# Patient Record
Sex: Male | Born: 1985 | Race: White | Hispanic: No | Marital: Married | Smoking: Never smoker
Health system: Southern US, Community
[De-identification: ages and names within clinical notes are randomized; demographics above are authoritative.]

## PROBLEM LIST (undated history)

## (undated) ENCOUNTER — Emergency Department (HOSPITAL_COMMUNITY): Admission: EM | Payer: BC Managed Care – PPO | Source: Home / Self Care

## (undated) DIAGNOSIS — K760 Fatty (change of) liver, not elsewhere classified: Secondary | ICD-10-CM

## (undated) DIAGNOSIS — T7840XA Allergy, unspecified, initial encounter: Secondary | ICD-10-CM

## (undated) DIAGNOSIS — K900 Celiac disease: Secondary | ICD-10-CM

## (undated) DIAGNOSIS — R569 Unspecified convulsions: Secondary | ICD-10-CM

## (undated) DIAGNOSIS — K219 Gastro-esophageal reflux disease without esophagitis: Secondary | ICD-10-CM

## (undated) HISTORY — DX: Unspecified convulsions: R56.9

## (undated) HISTORY — DX: Celiac disease: K90.0

## (undated) HISTORY — PX: UPPER GASTROINTESTINAL ENDOSCOPY: SHX188

## (undated) HISTORY — PX: HAND SURGERY: SHX662

## (undated) HISTORY — DX: Gastro-esophageal reflux disease without esophagitis: K21.9

## (undated) HISTORY — DX: Fatty (change of) liver, not elsewhere classified: K76.0

## (undated) HISTORY — DX: Allergy, unspecified, initial encounter: T78.40XA

---
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
## 2009-03-19 ENCOUNTER — Emergency Department (HOSPITAL_COMMUNITY): Admission: EM | Admit: 2009-03-19 | Discharge: 2009-03-19 | Payer: Self-pay | Admitting: Emergency Medicine

## 2009-04-20 ENCOUNTER — Emergency Department (HOSPITAL_COMMUNITY): Admission: EM | Admit: 2009-04-20 | Discharge: 2009-04-20 | Payer: Self-pay | Admitting: Emergency Medicine

## 2009-05-05 ENCOUNTER — Emergency Department (HOSPITAL_COMMUNITY): Admission: EM | Admit: 2009-05-05 | Discharge: 2009-05-05 | Payer: Self-pay | Admitting: Emergency Medicine

## 2011-09-24 ENCOUNTER — Encounter: Payer: Self-pay | Admitting: Gastroenterology

## 2011-09-24 ENCOUNTER — Encounter: Payer: Self-pay | Admitting: Internal Medicine

## 2016-03-04 ENCOUNTER — Encounter: Payer: Self-pay | Admitting: Family Medicine

## 2016-03-04 ENCOUNTER — Ambulatory Visit (INDEPENDENT_AMBULATORY_CARE_PROVIDER_SITE_OTHER): Payer: Medicaid Other | Admitting: Family Medicine

## 2016-03-04 VITALS — BP 114/74 | HR 69 | Temp 97.0°F | Ht 74.0 in | Wt 238.6 lb

## 2016-03-04 DIAGNOSIS — K900 Celiac disease: Secondary | ICD-10-CM

## 2016-03-04 DIAGNOSIS — K529 Noninfective gastroenteritis and colitis, unspecified: Secondary | ICD-10-CM | POA: Diagnosis not present

## 2016-03-04 DIAGNOSIS — Z0000 Encounter for general adult medical examination without abnormal findings: Secondary | ICD-10-CM

## 2016-03-04 MED ORDER — RIFAXIMIN 550 MG PO TABS: 550.0000 mg | ORAL_TABLET | Freq: Three times a day (TID) | ORAL | Status: DC

## 2016-03-04 NOTE — Progress Notes (Signed)
   HPI  Patient presents today here to establish care.  Patient has complaints of chronic diarrhea, 5-7 episodes daily without medication. He comes well using 4 Imodium daily. He states that his symptoms come on along with crampy lower abdominal pain that is mild to moderate, that is relieved by bowel movement. He has no blood in his diarrhea. He is diagnosed with celiac disease with an EGD when he was 30 years old.  He states that he has had mild anxiety symptoms. There. With mild shortness of breath and mild palpitations No lightheadedness, chest pain, or dizziness with symptoms.  He works out, [employer] 2-3 times a week, also playing basketball. He watches his diet moderately carefully He has a cup of coffee and one soda daily.   PMH: Smoking status noted His past medical, surgical, social, family history reviewed and updated in EMR His past medical history significant for seizure disorder and celiac disease His family history significant for diabetes in all grandparents as well as heart disease in his father and hypertension in father and sister. ROS: Per HPI  Objective: BP 114/74 mmHg  Pulse 69  Temp(Src) 97 F (36.1 C) (Oral)  Ht _0  (1.88 m)  Wt 238 lb 9.6 oz (108.228 kg)  BMI 30.62 kg/m2 Gen: NAD, alert, cooperative with exam HEENT: NCAT, EOMI, PERRL CV: RRR, good S1/S2, no murmur Resp: CTABL, no wheezes, non-labored Abd: SNTND, BS present, no guarding or organomegaly Ext: No edema, warm Neuro: Alert and oriented, strength 5/5 and sensation intact in all 4 extremities  Assessment and plan:  # Celiac disease Doing very well avoiding gluten, , no complaints States that he is confident that his diarrhea is not caused by this as he avoids gluten very well. Also avoiding extra lactose.  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 CMP14+EGFR  . CBC with Differential  . Lipid Panel  . TSH    Meds ordered this encounter  Medications  . Loperamide HCl (ANTI-DIARRHEAL PO)    Sig: Take by mouth.  . rifaximin (XIFAXAN) 550 MG TABS tablet    Sig: Take 1 tablet (550 mg total) by mouth 3 (three) times daily.    Dispense:  42 tablet    Refill:  0    Laroy Apple, MD [facility] 03/04/2016, 2:05 PM

## 2016-03-04 NOTE — Patient Instructions (Signed)
Great to meet you!  Try the xifaxan for your diarrhea, 1 pill three times a day for 2 weeks  Conservative measures for IBS- diarrheal type include Eating a good pro-biotic containing yogurt daily (activia is one example)  Taking metamucil 1 scoop daily  Lets follow up in 1 month to see how you are doing

## 2016-03-05 LAB — LIPID PANEL
CHOLESTEROL TOTAL: 192 mg/dL (ref 100–199)
Chol/HDL Ratio: 5.5 ratio units — ABNORMAL HIGH (ref 0.0–5.0)
HDL: 35 mg/dL — ABNORMAL LOW (ref >39)
LDL CALC: 109 mg/dL — AB (ref 0–99)
TRIGLYCERIDES: 238 mg/dL — AB (ref 0–149)
VLDL Cholesterol Cal: 48 mg/dL — ABNORMAL HIGH (ref 5–40)

## 2016-03-05 LAB — CBC WITH DIFFERENTIAL/PLATELET
BASOS: 1 %
Basophils Absolute: 0.1 10*3/uL (ref 0.0–0.2)
EOS (ABSOLUTE): 0.3 10*3/uL (ref 0.0–0.4)
EOS: 4 %
HEMOGLOBIN: 16.4 g/dL (ref 12.6–17.7)
Hematocrit: 47 % (ref 37.5–51.0)
IMMATURE GRANS (ABS): 0 10*3/uL (ref 0.0–0.1)
IMMATURE GRANULOCYTES: 0 %
LYMPHS ABS: 2.2 10*3/uL (ref 0.7–3.1)
Lymphs: 27 %
MCH: 31.1 pg (ref 26.6–33.0)
MCHC: 34.9 g/dL (ref 31.5–35.7)
MCV: 89 fL (ref 79–97)
MONOS ABS: 0.7 10*3/uL (ref 0.1–0.9)
Monocytes: 9 %
NEUTROS ABS: 4.9 10*3/uL (ref 1.4–7.0)
NEUTROS PCT: 59 %
PLATELETS: 300 10*3/uL (ref 150–379)
RBC: 5.28 x10E6/uL (ref 4.14–5.80)
RDW: 13.9 % (ref 12.3–15.4)
WBC: 8.1 10*3/uL (ref 3.4–10.8)

## 2016-03-05 LAB — CMP14+EGFR
ALBUMIN: 4.9 g/dL (ref 3.5–5.5)
ALT: 61 IU/L — ABNORMAL HIGH (ref 0–44)
AST: 30 IU/L (ref 0–40)
Albumin/Globulin Ratio: 1.8 (ref 1.2–2.2)
Alkaline Phosphatase: 83 IU/L (ref 39–117)
BUN / CREAT RATIO: 13 (ref 9–20)
BUN: 11 mg/dL (ref 6–20)
Bilirubin Total: 0.8 mg/dL (ref 0.0–1.2)
CALCIUM: 9.6 mg/dL (ref 8.7–10.2)
CO2: 25 mmol/L (ref 18–29)
CREATININE: 0.88 mg/dL (ref 0.76–1.27)
Chloride: 99 mmol/L (ref 96–106)
GFR, EST AFRICAN AMERICAN: 132 mL/min/{1.73_m2} (ref >59)
GFR, EST NON AFRICAN AMERICAN: 114 mL/min/{1.73_m2} (ref >59)
GLOBULIN, TOTAL: 2.7 g/dL (ref 1.5–4.5)
Glucose: 86 mg/dL (ref 65–99)
Potassium: 4.1 mmol/L (ref 3.5–5.2)
SODIUM: 140 mmol/L (ref 134–144)
TOTAL PROTEIN: 7.6 g/dL (ref 6.0–8.5)

## 2016-03-05 LAB — TSH: TSH: 2.54 u[IU]/mL (ref 0.450–4.500)

## 2016-04-04 ENCOUNTER — Ambulatory Visit (INDEPENDENT_AMBULATORY_CARE_PROVIDER_SITE_OTHER): Payer: Medicaid Other | Admitting: Family Medicine

## 2016-04-04 ENCOUNTER — Encounter: Payer: Self-pay | Admitting: Family Medicine

## 2016-04-04 VITALS — BP 133/75 | HR 72 | Temp 97.1°F | Ht 74.0 in | Wt 241.6 lb

## 2016-04-04 DIAGNOSIS — R7401 Elevation of levels of liver transaminase levels: Secondary | ICD-10-CM

## 2016-04-04 DIAGNOSIS — K529 Noninfective gastroenteritis and colitis, unspecified: Secondary | ICD-10-CM | POA: Diagnosis not present

## 2016-04-04 DIAGNOSIS — R740 Nonspecific elevation of levels of transaminase and lactic acid dehydrogenase [LDH]: Secondary | ICD-10-CM

## 2016-04-04 MED ORDER — DICYCLOMINE HCL 20 MG PO TABS: 20.0000 mg | ORAL_TABLET | Freq: Three times a day (TID) | ORAL | Status: DC

## 2016-04-04 NOTE — Patient Instructions (Signed)
Great to see you!  I have sent dicyclomine (bentyl) for your loose stools, try 1 pill before each meal  Come back in 1 month for follow up

## 2016-04-04 NOTE — Progress Notes (Signed)
   HPI  Patient presents today here to follow-up for chronic diarrhea.  Patient tried a course of xifaxim, helped while he was on, he has now returned to his normal stool pattern. He has 6-7 loose stools daily.  He has celiac disease and avoid school easily.  He has been taking Imodium with mild improvement. 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  # Elevated transaminase, elevated ALT Discussed diet and exercise, possible developing fatty liver If persistent after 6 months consider ultrasound   Meds ordered this encounter  Medications  . dicyclomine (BENTYL) 20 MG tablet    Sig: Take 1 tablet (20 mg total) by mouth 3 (three) times daily before meals.    Dispense:  90 tablet    Refill:  [place], MD [facility] 04/04/2016, 3:13 PM

## 2016-05-07 ENCOUNTER — Ambulatory Visit (INDEPENDENT_AMBULATORY_CARE_PROVIDER_SITE_OTHER): Payer: Medicaid Other | Admitting: Family Medicine

## 2016-05-07 ENCOUNTER — Encounter: Payer: Self-pay | Admitting: Family Medicine

## 2016-05-07 VITALS — BP 124/72 | HR 82 | Temp 96.8°F | Ht 74.0 in | Wt 242.6 lb

## 2016-05-07 DIAGNOSIS — K529 Noninfective gastroenteritis and colitis, unspecified: Secondary | ICD-10-CM | POA: Diagnosis not present

## 2016-05-07 DIAGNOSIS — K900 Celiac disease: Secondary | ICD-10-CM

## 2016-05-07 NOTE — Patient Instructions (Addendum)
Great to see you!  I have sent a referral for a GI doctor for your diarrhea.   Continue to use bentyl as it seems like it is helping you with cramping.

## 2016-05-07 NOTE — Progress Notes (Signed)
   HPI  Patient presents today here for follow-up diarrhea.  Patient's menses had a long history of chronic diarrhea. He states this is been going on for years. He has reasonable control of symptoms with 4 Imodium daily. He was having abdominal cramps with it which have been helped by Bentyl.  We tried a course of rifaximin which helped transiently with return of diarrhea shortly after the medication..  Explains that when he was 30 years old he had issues with blood in his stool and vomit. He was told that he had a "knot in his stomach size of a fist". He does not remember having an EGD or colonoscopy but he was diagnosed with celiac disease. He states that last night he cut out milk products completely, he has changed from 6 stools daily to 4 stools daily.  He explains that he is on permanent disability, however the reason behind it is very unclear.  We are requesting GI records today. He describes being seen 15 years ago at [facility] by Dr. Dollene Primrose  PMH: Smoking status noted ROS: Per HPI  Objective: BP 124/72 mmHg  Pulse 82  Temp(Src) 96.8 F (36 C) (Oral)  Ht 6\' 2"  (1.88 m)  Wt 242 lb 9.6 oz (110.043 kg)  BMI 31.13 kg/m2 Gen: NAD, alert, cooperative with exam HEENT: NCAT CV: RRR, good S1/S2, no murmur Resp: CTABL, no wheezes, non-labored Ext: No edema, warm Neuro: Alert and oriented, No gross deficits  Assessment and plan:  # Chronic diarrhea Patient with only improvement in cramping with Bentyl. I suspect IBS diarrheal type, however he's having symptoms that wake him up in the middle the night and have persisted for years now. We are requesting records from his previous GI doctor Referring to GI, [place], He is not been back to GI since he was 30 years old, approximately 15 years ago.  He feels that he is avoiding gluten very well.      Orders Placed This Encounter  Procedures  . Ambulatory referral to Gastroenterology    Referral Priority:  Routine    Referral  Type:  Consultation    Referral Reason:  Specialty Services Required    Number of Visits Requested:  [place], MD [facility] 05/07/2016, 4:29 PM

## 2016-05-08 ENCOUNTER — Encounter: Payer: Self-pay | Admitting: Gastroenterology

## 2016-06-20 HISTORY — PX: COLONOSCOPY: SHX174

## 2016-07-14 ENCOUNTER — Ambulatory Visit (INDEPENDENT_AMBULATORY_CARE_PROVIDER_SITE_OTHER): Payer: Medicaid Other | Admitting: Gastroenterology

## 2016-07-14 ENCOUNTER — Other Ambulatory Visit (INDEPENDENT_AMBULATORY_CARE_PROVIDER_SITE_OTHER): Payer: Medicaid Other

## 2016-07-14 ENCOUNTER — Encounter: Payer: Self-pay | Admitting: Gastroenterology

## 2016-07-14 ENCOUNTER — Encounter (INDEPENDENT_AMBULATORY_CARE_PROVIDER_SITE_OTHER): Payer: Self-pay

## 2016-07-14 VITALS — BP 114/76 | HR 80 | Ht 73.0 in | Wt 245.1 lb

## 2016-07-14 DIAGNOSIS — R140 Abdominal distension (gaseous): Secondary | ICD-10-CM | POA: Diagnosis not present

## 2016-07-14 DIAGNOSIS — R197 Diarrhea, unspecified: Secondary | ICD-10-CM | POA: Diagnosis not present

## 2016-07-14 DIAGNOSIS — R7401 Elevation of levels of liver transaminase levels: Secondary | ICD-10-CM

## 2016-07-14 DIAGNOSIS — R740 Nonspecific elevation of levels of transaminase and lactic acid dehydrogenase [LDH]: Secondary | ICD-10-CM | POA: Diagnosis not present

## 2016-07-14 LAB — CBC WITH DIFFERENTIAL/PLATELET
BASOS ABS: 0 10*3/uL (ref 0.0–0.1)
Basophils Relative: 0.4 % (ref 0.0–3.0)
EOS ABS: 0.3 10*3/uL (ref 0.0–0.7)
Eosinophils Relative: 4.5 % (ref 0.0–5.0)
HCT: 47 % (ref 39.0–52.0)
Hemoglobin: 16.9 g/dL (ref 13.0–17.0)
LYMPHS ABS: 1.6 10*3/uL (ref 0.7–4.0)
Lymphocytes Relative: 20.7 % (ref 12.0–46.0)
MCHC: 35.9 g/dL (ref 30.0–36.0)
MCV: 89.9 fl (ref 78.0–100.0)
Monocytes Absolute: 0.8 10*3/uL (ref 0.1–1.0)
Monocytes Relative: 10.6 % (ref 3.0–12.0)
NEUTROS ABS: 4.8 10*3/uL (ref 1.4–7.7)
NEUTROS PCT: 63.8 % (ref 43.0–77.0)
PLATELETS: 330 10*3/uL (ref 150.0–400.0)
RBC: 5.23 Mil/uL (ref 4.22–5.81)
RDW: 12.5 % (ref 11.5–15.5)
WBC: 7.5 10*3/uL (ref 4.0–10.5)

## 2016-07-14 LAB — VITAMIN D 25 HYDROXY (VIT D DEFICIENCY, FRACTURES): VITD: 23.51 ng/mL — AB (ref 30.00–100.00)

## 2016-07-14 LAB — HEPATIC FUNCTION PANEL
ALBUMIN: 4.3 g/dL (ref 3.5–5.2)
ALK PHOS: 73 U/L (ref 39–117)
ALT: 30 U/L (ref 0–53)
AST: 17 U/L (ref 0–37)
BILIRUBIN DIRECT: 0 mg/dL (ref 0.0–0.3)
Total Bilirubin: 0.7 mg/dL (ref 0.2–1.2)
Total Protein: 7.5 g/dL (ref 6.0–8.3)

## 2016-07-14 LAB — IGA: IgA: 338 mg/dL (ref 68–378)

## 2016-07-14 MED ORDER — NA SULFATE-K SULFATE-MG SULF 17.5-3.13-1.6 GM/177ML PO SOLN: 1.0000 | ORAL | 0 refills | Status: DC

## 2016-07-14 NOTE — Progress Notes (Signed)
HPI :  30 y/o male with a reported history of celiac disease and seizure disorder, here for a new patient visit for celiac disease.   He reports being diagnosed with celiac disease 16 years ago, at age 78. He endorses having had blood testing and endoscopy at the time. He reports having an abnormality in his stomach on EGD, he refers to as a a "knot" in his stomach, which stated he could have had surgery but left in place as he was told it was "benign". This was done by Dr. Matthew Saras in [place]. Reports of EGD not available, but he had a prior CT imaging at the time which did not show any significant pathology other than duodenitis.  He reports he has been on a gluten free diet longstanding, he thinks very strict about it. He reports about 6 BMs per day, loose stools, which has been his baseline. He has been taking immodium upwards of 4 BMs per day which has not helped too much. He stopped taking this as it has not helped at all. He is also taking bentyl for cramps which he thinks has helped a little bit. He has not seen any blood in his stools. He endorses some ongoing bloating / abdominal discomfort which bothers him. He thinks he has gained weight recently. No nightsweats or fevers.   No prior colonoscopy. No FH of known celiac disease. He thinks since starting gluten free diet he has not had significant improvement.   He denies any NSAID use.    Past Medical History:  Diagnosis Date  . Celiac disease   . Seizures ([place])    when patient was 30 years old     Past Surgical History:  Procedure Laterality Date  . HAND SURGERY Right    Family History  Problem Relation Age of Onset  . Hypertension Father   . Diabetes Maternal Grandmother   . Diabetes Maternal Grandfather   . Diabetes Paternal Grandmother   . Breast cancer Paternal Grandmother   . Diabetes Paternal Grandfather   . Hypertension Sister   . Heart disease Paternal Uncle    Social History  Substance Use Topics  .  Smoking status: Never Smoker  . Smokeless tobacco: Never Used  . Alcohol use Yes     Comment: 1 cocktail at night   Current Outpatient Prescriptions  Medication Sig Dispense Refill  . dicyclomine (BENTYL) 20 MG tablet Take 1 tablet (20 mg total) by mouth 3 (three) times daily before meals. 90 tablet 3  . Multiple Vitamin (MULTIVITAMIN) tablet Take 1 tablet by mouth daily.     No current facility-administered medications for this visit.    No Known Allergies   Review of Systems: All systems reviewed and negative except where noted in HPI.   Lab Results  Component Value Date   WBC 8.1 03/04/2016   HCT 47.0 03/04/2016   MCV 89 03/04/2016   PLT 300 03/04/2016    Lab Results  Component Value Date   CREATININE 0.88 03/04/2016   BUN 11 03/04/2016   NA 140 03/04/2016   K 4.1 03/04/2016   CL 99 03/04/2016   CO2 25 03/04/2016    Lab Results  Component Value Date   ALT 61 (H) 03/04/2016   AST 30 03/04/2016   ALKPHOS 83 03/04/2016   BILITOT 0.8 03/04/2016     Physical Exam: BP 114/76 (BP Location: Left Arm, Patient Position: Sitting)   Pulse 80   Ht 6\' 1"  (1.854 m) Comment: height  measured without shoes  Wt 245 lb 2 oz (111.2 kg)   BMI 32.34 kg/m  Constitutional: Pleasant,well-developed, male in no acute distress. HEENT: Normocephalic and atraumatic. Conjunctivae are normal. No scleral icterus. Neck supple.  Cardiovascular: Normal rate, regular rhythm.  Pulmonary/chest: Effort normal and breath sounds normal. No wheezing, rales or rhonchi. Abdominal: Soft, nondistended, nontender. Bowel sounds active throughout. There are no masses palpable. No hepatomegaly. Extremities: no edema Lymphadenopathy: No cervical adenopathy noted. Neurological: Alert and oriented to person place and time. Skin: Skin is warm and dry. No rashes noted. Psychiatric: Normal mood and affect. Behavior is normal.   ASSESSMENT AND PLAN: 30 y/o male with history of reported celiac with ongoing  bloating / diarrhea despite gluten free diet. He reports persistent symptoms for years despite gluten free diet. Unclear at this time if he has active celiac disease driving his symptoms, or another process. Given his history of celiac, he is also at risk for microscopic colitis. Recommend labs with TTG, IgA to see if celiac is active, as well as baseline CBC, LFTs and vitamin D level (he reports history of elevated ALT - which may be related to celiac if active). Ultimately recommend an EGD to reassess his celiac activity, and also offered him a colonoscopy to ensure no microscopic colitis / IBD. Discussed risks / benefits of endoscopy with him and he wished to proceed. Unclear what he is referring to in regards to prior EGD abnormality in his stomach, will obtain reports if possible. If ALT elevation persists, will further workup with labs and [place], although as above, could be related to active celiac if this is found.   [place] Cellar, MD [facility] Pager [phone]  CC: Timmothy Euler, MD

## 2016-07-14 NOTE — Patient Instructions (Signed)
If you are age 32 or older, your body mass index should be between 23-30. Your Body mass index is 32.34 kg/m. If this is out of the aforementioned range listed, please consider follow up with your Primary Care Provider.  If you are age 44 or younger, your body mass index should be between 19-25. Your Body mass index is 32.34 kg/m. If this is out of the aformentioned range listed, please consider follow up with your Primary Care Provider.   You have been scheduled for a colonoscopy and endoscopy. Please follow written instructions given to you at your visit today.  Please pick up your prep supplies at the pharmacy within the next 1-3 days. If you use inhalers (even only as needed), please bring them with you on the day of your procedure. Your physician has requested that you go to www.startemmi.com and enter the access code given to you at your visit today. This web site gives a general overview about your procedure. However, you should still follow specific instructions given to you by our office regarding your preparation for the procedure.  Your physician has requested that you go to the basement for lab work before leaving today:

## 2016-07-15 ENCOUNTER — Encounter: Payer: Self-pay | Admitting: Gastroenterology

## 2016-07-15 ENCOUNTER — Ambulatory Visit (AMBULATORY_SURGERY_CENTER): Payer: Medicaid Other | Admitting: Gastroenterology

## 2016-07-15 VITALS — BP 123/88 | HR 65 | Temp 97.8°F | Resp 16 | Ht 73.0 in | Wt 245.0 lb

## 2016-07-15 DIAGNOSIS — D123 Benign neoplasm of transverse colon: Secondary | ICD-10-CM | POA: Diagnosis not present

## 2016-07-15 DIAGNOSIS — K209 Esophagitis, unspecified without bleeding: Secondary | ICD-10-CM

## 2016-07-15 DIAGNOSIS — R197 Diarrhea, unspecified: Secondary | ICD-10-CM

## 2016-07-15 DIAGNOSIS — D120 Benign neoplasm of cecum: Secondary | ICD-10-CM | POA: Diagnosis not present

## 2016-07-15 DIAGNOSIS — K900 Celiac disease: Secondary | ICD-10-CM | POA: Diagnosis not present

## 2016-07-15 DIAGNOSIS — K3189 Other diseases of stomach and duodenum: Secondary | ICD-10-CM | POA: Diagnosis not present

## 2016-07-15 LAB — TISSUE TRANSGLUTAMINASE, IGA: Tissue Transglutaminase Ab, IgA: 2 U/mL (ref <4)

## 2016-07-15 MED ORDER — OMEPRAZOLE 20 MG PO CPDR: 20.0000 mg | DELAYED_RELEASE_CAPSULE | Freq: Every day | ORAL | 3 refills | Status: DC

## 2016-07-15 MED ORDER — SODIUM CHLORIDE 0.9 % IV SOLN: 500.0000 mL | INTRAVENOUS | Status: DC

## 2016-07-15 NOTE — Op Note (Signed)
[place] [facility] Patient Name: Edward Rivera Procedure Date: 07/15/2016 9:45 AM MRN: [id number] Endoscopist: Remo Lipps P. Havery Moros , MD Age: 30 Referring MD:  Date of Birth: 03/07/1986 Gender: Male Account #: [id number] Procedure:                Upper GI endoscopy Indications:              Follow-up of reported celiac disease, ongoing loose                            stools Medicines:                Monitored Anesthesia Care Procedure:                Pre-Anesthesia Assessment:                           - Prior to the procedure, a History and Physical                            was performed, and patient medications and                            allergies were reviewed. The patient's tolerance of                            previous anesthesia was also reviewed. The risks                            and benefits of the procedure and the sedation                            options and risks were discussed with the patient.                            All questions were answered, and informed consent                            was obtained. Prior Anticoagulants: The patient has                            taken no previous anticoagulant or antiplatelet                            agents. ASA Grade Assessment: II - A patient with                            mild systemic disease. After reviewing the risks                            and benefits, the patient was deemed in                            satisfactory condition to undergo the procedure.  After obtaining informed consent, the endoscope was                            passed under direct vision. Throughout the                            procedure, the patient's blood pressure, pulse, and                            oxygen saturations were monitored continuously. The                            Model GIF-HQ190 [id number]) scope was introduced                            through the mouth, and advanced to the second  part                            of duodenum. The upper GI endoscopy was                            accomplished without difficulty. The patient                            tolerated the procedure well. Scope In: Scope Out: Findings:                 Esophagogastric landmarks were identified: the                            Z-line was found at 39 cm, the gastroesophageal                            junction was found at 39 cm and the upper extent of                            the gastric folds was found at 39 cm from the                            incisors.                           Mild esophagitis was found at the GEJ.                           Mucosal changes characterized by furrowing and                            trachealization were found in the lower third of                            the esophagus. Biopsies were taken with a cold  forceps for histology to rule out EoE.                           The exam of the esophagus was otherwise normal.                           The entire examined stomach was normal.                           The duodenal bulb and second portion of the                            duodenum were normal. Biopsies for histology were                            taken with a cold forceps for evaluation of celiac                            disease. Complications:            No immediate complications. Estimated blood loss:                            Minimal. Estimated Blood Loss:     Estimated blood loss was minimal. Impression:               - Esophagogastric landmarks identified.                           - Mild esophagitis.                           - Changes concerning for possible EoE in the distal                            esophagus. Biopsied.                           - Normal stomach.                           - Normal duodenal bulb and second portion of the                            duodenum. Biopsied. Recommendation:           -  Patient has a contact number available for                            emergencies. The signs and symptoms of potential                            delayed complications were discussed with the                            patient. Return to normal activities tomorrow.  Written discharge instructions were provided to the                            patient.                           - Resume previous diet.                           - Continue present medications.                           - Await pathology results.                           - Trial of omeprazole 20mg  daily for one month for                            treatment of esophagitis. Remo Lipps P. [place]br[place], MD 07/15/2016 10:33:31 AM This report has been signed electronically.

## 2016-07-15 NOTE — Patient Instructions (Signed)
YOU HAD AN ENDOSCOPIC PROCEDURE TODAY AT [facility]:   Refer to the procedure report that was given to you for any specific questions about what was found during the examination.  If the procedure report does not answer your questions, please call your gastroenterologist to clarify.  If you requested that your care partner not be given the details of your procedure findings, then the procedure report has been included in a sealed envelope for you to review at your convenience later.  YOU SHOULD EXPECT: Some feelings of bloating in the abdomen. Passage of more gas than usual.  Walking can help get rid of the air that was put into your GI tract during the procedure and reduce the bloating. If you had a lower endoscopy (such as a colonoscopy or flexible sigmoidoscopy) you may notice spotting of blood in your stool or on the toilet paper. If you underwent a bowel prep for your procedure, you may not have a normal bowel movement for a few days.  Please Note:  You might notice some irritation and congestion in your nose or some drainage.  This is from the oxygen used during your procedure.  There is no need for concern and it should clear up in a day or so.  SYMPTOMS TO REPORT IMMEDIATELY:   Following lower endoscopy (colonoscopy or flexible sigmoidoscopy):  Excessive amounts of blood in the stool  Significant tenderness or worsening of abdominal pains  Swelling of the abdomen that is new, acute  Fever of 100F or higher   Following upper endoscopy (EGD)  Vomiting of blood or coffee ground material  New chest pain or pain under the shoulder blades  Painful or persistently difficult swallowing  New shortness of breath  Fever of 100F or higher  Black, tarry-looking stools  For urgent or emergent issues, a gastroenterologist can be reached at any hour by calling [phone].   DIET:  We do recommend a small meal at first, but then you may proceed to your regular diet.  Drink  plenty of fluids but you should avoid alcoholic beverages for 24 hours.  ACTIVITY:  You should plan to take it easy for the rest of today and you should NOT DRIVE or use heavy machinery until tomorrow (because of the sedation medicines used during the test).    FOLLOW UP: Our staff will call the number listed on your records the next business day following your procedure to check on you and address any questions or concerns that you may have regarding the information given to you following your procedure. If we do not reach you, we will leave a message.  However, if you are feeling well and you are not experiencing any problems, there is no need to return our call.  We will assume that you have returned to your regular daily activities without incident.  If any biopsies were taken you will be contacted by phone or by letter within the next 1-3 weeks.  Please call us at [phone] if you have not heard about the biopsies in 3 weeks.    SIGNATURES/CONFIDENTIALITY: You and/or your care partner have signed paperwork which will be entered into your electronic medical record.  These signatures attest to the fact that that the information above on your After Visit Summary has been reviewed and is understood.  Full responsibility of the confidentiality of this discharge information lies with you and/or your care-partner.   Esophagitis information given.  Omeprazole 20mg . Daily for one month  trial.  Polyp information given.  No aspirin, naproxen, or ibuprofen or other non steroidal anti-inflammatory meds for 2 weeks.

## 2016-07-15 NOTE — Progress Notes (Signed)
Report given to PACU RN, vss 

## 2016-07-15 NOTE — Op Note (Signed)
[facility] Patient Name: Edward Rivera Procedure Date: 07/15/2016 9:45 AM MRN: [id number] Endoscopist: Remo Lipps P. Havery Moros , MD Age: 30 Referring MD:  Date of Birth: 23-Nov-1985 Gender: Male Account #: [id number] Procedure:                Colonoscopy Indications:              Chronic diarrhea, reported history of celiac, no                            improvement on gluten free diet Medicines:                Monitored Anesthesia Care Procedure:                Pre-Anesthesia Assessment:                           - Prior to the procedure, a History and Physical                            was performed, and patient medications and                            allergies were reviewed. The patient's tolerance of                            previous anesthesia was also reviewed. The risks                            and benefits of the procedure and the sedation                            options and risks were discussed with the patient.                            All questions were answered, and informed consent                            was obtained. Prior Anticoagulants: The patient has                            taken no previous anticoagulant or antiplatelet                            agents. ASA Grade Assessment: II - A patient with                            mild systemic disease. After reviewing the risks                            and benefits, the patient was deemed in                            satisfactory condition to undergo the procedure.  After obtaining informed consent, the colonoscope                            was passed under direct vision. Throughout the                            procedure, the patient's blood pressure, pulse, and                            oxygen saturations were monitored continuously. The                            Model CF-HQ190L [id number]) scope was introduced                            through the anus and  advanced to the the terminal                            ileum, with identification of the appendiceal                            orifice and IC valve. The colonoscopy was performed                            without difficulty. The patient tolerated the                            procedure well. The quality of the bowel                            preparation was good. The terminal ileum, ileocecal                            valve, appendiceal orifice, and rectum were                            photographed. Scope In: 10:01:11 AM Scope Out: 10:20:16 AM Scope Withdrawal Time: 0 hours 13 minutes 47 seconds  Total Procedure Duration: 0 hours 19 minutes 5 seconds  Findings:                 The perianal and digital rectal examinations were                            normal.                           A roughly 8 to 10 mm polyp was found in the hepatic                            flexure. The polyp was sessile. The polyp was                            removed with a cold snare. Resection and retrieval  were complete.                           A diminutive polyp was found in the cecum. The                            polyp was sessile. The polyp was removed with a                            cold biopsy forceps. Resection and retrieval were                            complete.                           A localized area of nodular mucosa was found at the                            ileocecal valve. I suspect this is a benign normal                            variant, biopsies were taken with a cold forceps                            for histology to ensure no adenomatous change.                           The terminal ileum appeared normal.                           The exam was otherwise without abnormality on                            direct and retroflexion views.                           Biopsies for histology were taken with a cold                            forceps  from the right colon, left colon and                            transverse colon for evaluation of microscopic                            colitis. Complications:            No immediate complications. Estimated blood loss:                            Minimal. Estimated Blood Loss:     Estimated blood loss was minimal. Impression:               - One 8 to 10 mm polyp at the hepatic flexure,  removed with a cold snare. Resected and retrieved.                           - One diminutive polyp in the cecum, removed with a                            cold biopsy forceps. Resected and retrieved.                           - Nodular mucosa at the ileocecal valve. Biopsied.                           - The examined portion of the ileum was normal.                           - The examination was otherwise normal on direct                            and retroflexion views.                           - Biopsies were taken with a cold forceps from the                            right colon, left colon and transverse colon for                            evaluation of microscopic colitis. Recommendation:           - Patient has a contact number available for                            emergencies. The signs and symptoms of potential                            delayed complications were discussed with the                            patient. Return to normal activities tomorrow.                            Written discharge instructions were provided to the                            patient.                           - Resume previous diet.                           - Continue present medications.                           - No aspirin, ibuprofen, naproxen, or other  non-steroidal anti-inflammatory drugs for 2 weeks                            after polyp removal.                           - Await pathology results.                           - Repeat  colonoscopy is recommended for                            surveillance. The colonoscopy date will be                            determined after pathology results from today's                            exam become available for review. Remo Lipps P. Lyna Laningham, MD 07/15/2016 10:27:07 AM This report has been signed electronically.

## 2016-07-16 ENCOUNTER — Telehealth: Payer: Self-pay | Admitting: *Deleted

## 2016-07-16 ENCOUNTER — Telehealth: Payer: Self-pay

## 2016-07-16 NOTE — Progress Notes (Signed)
Letter mailed 07/16/16. jf

## 2016-07-16 NOTE — Telephone Encounter (Signed)
  Follow up Call-  Call back number 07/15/2016  Post procedure Call Back phone  # #[phone] cell  Permission to leave phone message Yes  Some recent data might be hidden    Patient was called for follow up after his procedure on 07/15/2016. No answer at the number given for follow up phone call. This was the second attempt to reach the patient. A message was left on the answering machine.

## 2016-07-16 NOTE — Telephone Encounter (Signed)
No answer, message left for the patient. 

## 2016-08-05 ENCOUNTER — Ambulatory Visit (INDEPENDENT_AMBULATORY_CARE_PROVIDER_SITE_OTHER): Payer: Medicaid Other | Admitting: Family Medicine

## 2016-08-05 ENCOUNTER — Encounter: Payer: Self-pay | Admitting: Family Medicine

## 2016-08-05 VITALS — BP 122/76 | HR 75 | Temp 96.7°F | Ht 73.0 in | Wt 249.0 lb

## 2016-08-05 DIAGNOSIS — J309 Allergic rhinitis, unspecified: Secondary | ICD-10-CM | POA: Diagnosis not present

## 2016-08-05 MED ORDER — CETIRIZINE HCL 10 MG PO TABS: 10.0000 mg | ORAL_TABLET | Freq: Every day | ORAL | 11 refills | Status: DC

## 2016-08-05 MED ORDER — MOMETASONE FUROATE 50 MCG/ACT NA SUSP: NASAL | 11 refills | Status: DC

## 2016-08-05 NOTE — Patient Instructions (Signed)
Great to see you!  Start zyrtec and nasonex daily, I think you will se improvement in a few days.

## 2016-08-05 NOTE — Progress Notes (Signed)
   HPI  Patient presents today here with nasal congestion and sore throat  He explains that he's been chopping wood outside all week and thought maybe it is allergies. He started having yellow mucus from the nose and came in for eval.   Deneis cough and dyspnea. Denies malaise, fever, chills, or feeling unwell.   He has bee clearing his throat a lot.   PMH: Smoking status noted ROS: Per HPI  Objective: BP 122/76   Pulse 75   Temp (!) 96.7 F (35.9 C) (Oral)   Ht 6\' 1"  (1.854 m)   Wt 249 lb (112.9 kg)   BMI 32.85 kg/m  Gen: NAD, alert, cooperative with exam HEENT: NCAT, nares with swollen turbinates BL, TMs WNL CV: RRR, good S1/S2, no murmur Resp: CTABL, no wheezes, non-labored Ext: No edema, warm Neuro: Alert and oriented, No gross deficits  Assessment and plan:  # Allergic rhinitis Start zyrtec and nasonex, RTC if worsening No signs of infection at this point.  Has GI follow up, reviewed recent results with him, he will maintain gluten free until GI clears him not to as to avoid skewing results.     Meds ordered this encounter  Medications  . cetirizine (ZYRTEC) 10 MG tablet    Sig: Take 1 tablet (10 mg total) by mouth daily.    Dispense:  30 tablet    Refill:  11  . mometasone (NASONEX) 50 MCG/ACT nasal spray    Sig: 2 sprays each nostril daily    Dispense:  17 g    Refill:  [place], MD [facility] 08/05/2016, 9:28 AM

## 2016-09-09 ENCOUNTER — Telehealth: Payer: Self-pay

## 2016-09-09 MED ORDER — FLUTICASONE PROPIONATE 50 MCG/ACT NA SUSP: 2.0000 | Freq: Every day | NASAL | 11 refills | Status: DC

## 2016-09-09 NOTE — Telephone Encounter (Signed)
Patient aware.

## 2016-09-09 NOTE — Telephone Encounter (Signed)
Change to flonase given formulary preference.   Laroy Apple, MD [facility] 09/09/2016, 11:56 AM

## 2016-10-06 ENCOUNTER — Ambulatory Visit: Payer: Medicaid Other | Admitting: *Deleted

## 2016-10-06 VITALS — Ht 74.0 in | Wt 253.0 lb

## 2016-10-06 DIAGNOSIS — Z86010 Personal history of colonic polyps: Secondary | ICD-10-CM

## 2016-10-06 MED ORDER — NA SULFATE-K SULFATE-MG SULF 17.5-3.13-1.6 GM/177ML PO SOLN: 1.0000 | Freq: Once | ORAL | 0 refills | Status: AC

## 2016-10-06 NOTE — Progress Notes (Signed)
Denies allergies to eggs or soy products. Denies complications with sedation or anesthesia. Denies O2 use. Denies use of diet or weight loss medications.  Emmi instructions not given for colonoscopy. Pt had procedure 3 months ago.

## 2016-10-15 ENCOUNTER — Encounter: Payer: Self-pay | Admitting: Gastroenterology

## 2016-10-15 ENCOUNTER — Ambulatory Visit (AMBULATORY_SURGERY_CENTER): Payer: Medicaid Other | Admitting: Gastroenterology

## 2016-10-15 VITALS — BP 108/69 | HR 76 | Temp 98.0°F | Resp 22 | Ht 74.0 in | Wt 253.0 lb

## 2016-10-15 DIAGNOSIS — D120 Benign neoplasm of cecum: Secondary | ICD-10-CM | POA: Diagnosis not present

## 2016-10-15 DIAGNOSIS — Z86010 Personal history of colonic polyps: Secondary | ICD-10-CM

## 2016-10-15 MED ORDER — SODIUM CHLORIDE 0.9 % IV SOLN: 500.0000 mL | INTRAVENOUS | Status: DC

## 2016-10-15 NOTE — Progress Notes (Signed)
To PACU, vss patent aw report to rn 

## 2016-10-15 NOTE — Op Note (Signed)
[facility] Patient Name: Edward Rivera Procedure Date: 10/15/2016 7:49 AM MRN: [id number] Endoscopist: Remo Lipps P. Armen Waring MD, MD Age: 30 Referring MD:  Date of Birth: 1986-07-29 Gender: Male Account #: [id number] Procedure:                Colonoscopy Indications:              High risk colon cancer surveillance: Personal                            history of colonic polyps - patient with multiple                            adenomas incidentally noted on colonoscopy in                            October to evaluate chronic diarrhea. Had a very                            subtle nodularity noted at the IC valve, biopsies                            returned c/w adenoma. Here for surveillance exam to                            evaluate IC valve, remove polyp entirely. Medicines:                Monitored Anesthesia Care Procedure:                Pre-Anesthesia Assessment:                           - Prior to the procedure, a History and Physical                            was performed, and patient medications and                            allergies were reviewed. The patient's tolerance of                            previous anesthesia was also reviewed. The risks                            and benefits of the procedure and the sedation                            options and risks were discussed with the patient.                            All questions were answered, and informed consent                            was obtained. Prior Anticoagulants: The patient has  taken no previous anticoagulant or antiplatelet                            agents. ASA Grade Assessment: II - A patient with                            mild systemic disease. After reviewing the risks                            and benefits, the patient was deemed in                            satisfactory condition to undergo the procedure.                           After obtaining  informed consent, the colonoscope                            was passed under direct vision. Throughout the                            procedure, the patient's blood pressure, pulse, and                            oxygen saturations were monitored continuously. The                            Model CF-HQ190L [id number]) scope was introduced                            through the anus and advanced to the the terminal                            ileum, with identification of the appendiceal                            orifice and IC valve. The colonoscopy was performed                            without difficulty. The patient tolerated the                            procedure well. The quality of the bowel                            preparation was good. The terminal ileum, ileocecal                            valve, appendiceal orifice, and rectum were                            photographed. Scope In: 8:01:05 AM Scope Out: 8:22:28 AM Scope Withdrawal Time: 0 hours 17 minutes 4 seconds  Total Procedure Duration: 0 hours 21  minutes 23 seconds  Findings:                 The perianal and digital rectal examinations were                            normal.                           A 5 to 6 mm polyp was found in the ileocecal valve,                            a clear border was appreciated when pleating back                            the valve with snare tip . The polyp was sessile.                            The polyp was removed with a cold snare. Resection                            and retrieval were complete.                           The terminal ileum appeared normal.                           The exam was otherwise without abnormality on                            direct and retroflexion views. Complications:            No immediate complications. Estimated blood loss:                            Minimal. Estimated Blood Loss:     Estimated blood loss was minimal. Impression:                - One 5 to 6 mm polyp at the ileocecal valve,                            removed with a cold snare. Resected and retrieved.                           - The examined portion of the ileum was normal.                           - The examination was otherwise normal on direct                            and retroflexion views. Recommendation:           - Patient has a contact number available for                            emergencies. The signs and symptoms of potential  delayed complications were discussed with the                            patient. Return to normal activities tomorrow.                            Written discharge instructions were provided to the                            patient.                           - Resume previous diet.                           - Continue present medications.                           - No aspirin, ibuprofen, naproxen, or other                            non-steroidal anti-inflammatory drugs for 2 weeks                            after polyp removal.                           - Await pathology results.                           - Repeat colonoscopy is recommended for                            surveillance in 3 years given history of adenoma up                            to 1cm in size on the last exam. Remo Lipps P. Seabron Iannello MD, MD 10/15/2016 8:27:41 AM This report has been signed electronically.

## 2016-10-15 NOTE — Progress Notes (Signed)
Called to room to assist during endoscopic procedure.  Patient ID and intended procedure confirmed with present staff. Received instructions for my participation in the procedure from the performing physician.  

## 2016-10-15 NOTE — Patient Instructions (Signed)
YOU HAD AN ENDOSCOPIC PROCEDURE TODAY AT [facility]:   Refer to the procedure report that was given to you for any specific questions about what was found during the examination.  If the procedure report does not answer your questions, please call your gastroenterologist to clarify.  If you requested that your care partner not be given the details of your procedure findings, then the procedure report has been included in a sealed envelope for you to review at your convenience later.  YOU SHOULD EXPECT: Some feelings of bloating in the abdomen. Passage of more gas than usual.  Walking can help get rid of the air that was put into your GI tract during the procedure and reduce the bloating. If you had a lower endoscopy (such as a colonoscopy or flexible sigmoidoscopy) you may notice spotting of blood in your stool or on the toilet paper. If you underwent a bowel prep for your procedure, you may not have a normal bowel movement for a few days.  Please Note:  You might notice some irritation and congestion in your nose or some drainage.  This is from the oxygen used during your procedure.  There is no need for concern and it should clear up in a day or so.  SYMPTOMS TO REPORT IMMEDIATELY:   Following lower endoscopy (colonoscopy or flexible sigmoidoscopy):  Excessive amounts of blood in the stool  Significant tenderness or worsening of abdominal pains  Swelling of the abdomen that is new, acute  Fever of 100F or higher  For urgent or emergent issues, a gastroenterologist can be reached at any hour by calling [phone].   DIET:  We do recommend a small meal at first, but then you may proceed to your regular diet.  Drink plenty of fluids but you should avoid alcoholic beverages for 24 hours.  ACTIVITY:  You should plan to take it easy for the rest of today and you should NOT DRIVE or use heavy machinery until tomorrow (because of the sedation medicines used during the test).     FOLLOW UP: Our staff will call the number listed on your records the next business day following your procedure to check on you and address any questions or concerns that you may have regarding the information given to you following your procedure. If we do not reach you, we will leave a message.  However, if you are feeling well and you are not experiencing any problems, there is no need to return our call.  We will assume that you have returned to your regular daily activities without incident.  Await biopsy results to determined follow up Colonoscopy possibly repeat in 3 years No Ibuprofen, Aspirin, Naproxen, or other non-steriodal anti-inflammatory drugs for next for two weeks after polyps removal.  If any biopsies were taken you will be contacted by phone or by letter within the next 1-3 weeks.  Please call us at [phone] if you have not heard about the biopsies in 3 weeks.    SIGNATURES/CONFIDENTIALITY: You and/or your care partner have signed paperwork which will be entered into your electronic medical record.  These signatures attest to the fact that that the information above on your After Visit Summary has been reviewed and is understood.  Full responsibility of the confidentiality of this discharge information lies with you and/or your care-partner.

## 2016-10-16 ENCOUNTER — Telehealth: Payer: Self-pay | Admitting: *Deleted

## 2016-10-16 ENCOUNTER — Telehealth: Payer: Self-pay

## 2016-10-16 NOTE — Telephone Encounter (Signed)
  Follow up Call-  Call back number 10/15/2016 07/15/2016  Post procedure Call Back phone  # [phone] cell  Permission to leave phone message Yes Yes  Some recent data might be hidden     No answer with 2nd follow up phone call attempt.  No message left on VM.

## 2016-10-16 NOTE — Telephone Encounter (Signed)
Number identifier. Left a voicemail. Will call back later.

## 2016-10-23 ENCOUNTER — Encounter: Payer: Self-pay | Admitting: Gastroenterology

## 2016-10-31 ENCOUNTER — Ambulatory Visit: Payer: Medicaid Other | Admitting: Family Medicine

## 2016-11-03 ENCOUNTER — Encounter: Payer: Self-pay | Admitting: Family Medicine

## 2016-11-11 ENCOUNTER — Ambulatory Visit (INDEPENDENT_AMBULATORY_CARE_PROVIDER_SITE_OTHER): Payer: Medicaid Other

## 2016-11-11 ENCOUNTER — Emergency Department (HOSPITAL_COMMUNITY): Payer: Medicaid Other | Admitting: Anesthesiology

## 2016-11-11 ENCOUNTER — Encounter: Payer: Self-pay | Admitting: Family Medicine

## 2016-11-11 ENCOUNTER — Encounter (HOSPITAL_COMMUNITY)
Admission: EM | Disposition: A | Discharge status: Home / Self Care | Payer: Self-pay | Source: Home / Self Care | Attending: General Surgery

## 2016-11-11 ENCOUNTER — Ambulatory Visit (HOSPITAL_COMMUNITY)
Admission: RE | Admit: 2016-11-11 | Discharge: 2016-11-11 | Disposition: A | Discharge status: Home / Self Care | Payer: Medicaid Other | Source: Ambulatory Visit | Attending: Family Medicine | Admitting: Family Medicine

## 2016-11-11 ENCOUNTER — Encounter (HOSPITAL_COMMUNITY): Payer: Self-pay

## 2016-11-11 ENCOUNTER — Inpatient Hospital Stay (HOSPITAL_COMMUNITY)
Admission: EM | Admit: 2016-11-11 | Discharge: 2016-11-13 | DRG: 343 | Disposition: A | Discharge status: Home / Self Care | Payer: Medicaid Other | Attending: General Surgery | Admitting: General Surgery

## 2016-11-11 ENCOUNTER — Ambulatory Visit (INDEPENDENT_AMBULATORY_CARE_PROVIDER_SITE_OTHER): Payer: Medicaid Other | Admitting: Family Medicine

## 2016-11-11 VITALS — BP 131/84 | HR 88 | Temp 96.9°F | Ht 74.0 in | Wt 254.0 lb

## 2016-11-11 DIAGNOSIS — R0609 Other forms of dyspnea: Secondary | ICD-10-CM | POA: Diagnosis not present

## 2016-11-11 DIAGNOSIS — K900 Celiac disease: Secondary | ICD-10-CM | POA: Diagnosis present

## 2016-11-11 DIAGNOSIS — R1030 Lower abdominal pain, unspecified: Secondary | ICD-10-CM

## 2016-11-11 DIAGNOSIS — K219 Gastro-esophageal reflux disease without esophagitis: Secondary | ICD-10-CM | POA: Diagnosis present

## 2016-11-11 DIAGNOSIS — Z79899 Other long term (current) drug therapy: Secondary | ICD-10-CM

## 2016-11-11 DIAGNOSIS — K3580 Unspecified acute appendicitis: Principal | ICD-10-CM | POA: Diagnosis present

## 2016-11-11 HISTORY — PX: LAPAROSCOPIC APPENDECTOMY: SHX408

## 2016-11-11 LAB — CBC WITH DIFFERENTIAL/PLATELET
BASOS: 0 %
Basophils Absolute: 0 10*3/uL (ref 0.0–0.2)
Basophils Absolute: 0 10*3/uL (ref 0.0–0.1)
Basophils Relative: 0 %
EOS (ABSOLUTE): 0.3 10*3/uL (ref 0.0–0.4)
EOS ABS: 0.2 10*3/uL (ref 0.0–0.7)
EOS: 3 %
Eosinophils Relative: 2 %
HCT: 45 % (ref 39.0–52.0)
HEMOGLOBIN: 16.6 g/dL (ref 13.0–17.7)
HEMOGLOBIN: 16.3 g/dL (ref 13.0–17.0)
Hematocrit: 46 % (ref 37.5–51.0)
LYMPHS ABS: 1.7 10*3/uL (ref 0.7–3.1)
LYMPHS ABS: 1.5 10*3/uL (ref 0.7–4.0)
Lymphocytes Relative: 16 %
Lymphs: 19 %
MCH: 32 pg (ref 26.6–33.0)
MCH: 31.8 pg (ref 26.0–34.0)
MCHC: 36.1 g/dL — ABNORMAL HIGH (ref 31.5–35.7)
MCHC: 36.2 g/dL — AB (ref 30.0–36.0)
MCV: 89 fL (ref 79–97)
MCV: 87.9 fL (ref 78.0–100.0)
MONOCYTES: 10 %
MONOS PCT: 9 %
Monocytes Absolute: 0.9 10*3/uL (ref 0.1–0.9)
Monocytes Absolute: 0.8 10*3/uL (ref 0.1–1.0)
NEUTROS PCT: 68 %
NEUTROS PCT: 73 %
Neutro Abs: 6.8 10*3/uL (ref 1.7–7.7)
Neutrophils Absolute: 6.2 10*3/uL (ref 1.4–7.0)
Platelets: 352 10*3/uL (ref 150–379)
Platelets: 313 10*3/uL (ref 150–400)
RBC: 5.19 x10E6/uL (ref 4.14–5.80)
RBC: 5.12 MIL/uL (ref 4.22–5.81)
RDW: 13.5 % (ref 12.3–15.4)
RDW: 12.7 % (ref 11.5–15.5)
WBC: 9.2 10*3/uL (ref 3.4–10.8)
WBC: 9.3 10*3/uL (ref 4.0–10.5)

## 2016-11-11 LAB — COMPREHENSIVE METABOLIC PANEL
A/G RATIO: 1.6 (ref 1.2–2.2)
ALBUMIN: 4.5 g/dL (ref 3.5–5.5)
ALT: 38 IU/L (ref 0–44)
AST: 19 IU/L (ref 0–40)
Alkaline Phosphatase: 83 IU/L (ref 39–117)
BILIRUBIN TOTAL: 0.4 mg/dL (ref 0.0–1.2)
BUN / CREAT RATIO: 8 — AB (ref 9–20)
BUN: 7 mg/dL (ref 6–20)
CHLORIDE: 100 mmol/L (ref 96–106)
CO2: 25 mmol/L (ref 18–29)
Calcium: 10.1 mg/dL (ref 8.7–10.2)
Creatinine, Ser: 0.83 mg/dL (ref 0.76–1.27)
GFR calc non Af Amer: 116 mL/min/{1.73_m2} (ref >59)
GFR, EST AFRICAN AMERICAN: 135 mL/min/{1.73_m2} (ref >59)
Globulin, Total: 2.8 g/dL (ref 1.5–4.5)
Glucose: 104 mg/dL — ABNORMAL HIGH (ref 65–99)
POTASSIUM: 4 mmol/L (ref 3.5–5.2)
SODIUM: 138 mmol/L (ref 134–144)
TOTAL PROTEIN: 7.3 g/dL (ref 6.0–8.5)

## 2016-11-11 LAB — BASIC METABOLIC PANEL
Anion gap: 9 (ref 5–15)
BUN: 7 mg/dL (ref 6–20)
CHLORIDE: 99 mmol/L — AB (ref 101–111)
CO2: 27 mmol/L (ref 22–32)
CREATININE: 0.97 mg/dL (ref 0.61–1.24)
Calcium: 9.2 mg/dL (ref 8.9–10.3)
GFR calc non Af Amer: >60 mL/min (ref >60)
Glucose, Bld: 97 mg/dL (ref 65–99)
Potassium: 3.9 mmol/L (ref 3.5–5.1)
Sodium: 135 mmol/L (ref 135–145)

## 2016-11-11 SURGERY — APPENDECTOMY, LAPAROSCOPIC
Anesthesia: General

## 2016-11-11 MED ORDER — IOPAMIDOL (ISOVUE-300) INJECTION 61%
INTRAVENOUS | Status: AC
  Filled 2016-11-11: qty 30

## 2016-11-11 MED ORDER — POVIDONE-IODINE 10 % OINT PACKET
TOPICAL_OINTMENT | CUTANEOUS | Status: DC | PRN
  Administered 2016-11-11: 1 via TOPICAL

## 2016-11-11 MED ORDER — BUPIVACAINE HCL (PF) 0.5 % IJ SOLN
INTRAMUSCULAR | Status: AC
  Filled 2016-11-11: qty 30

## 2016-11-11 MED ORDER — PANTOPRAZOLE SODIUM 40 MG PO TBEC
40.0000 mg | DELAYED_RELEASE_TABLET | Freq: Every day | ORAL | Status: DC
  Administered 2016-11-12: 40 mg via ORAL
  Filled 2016-11-11 (×3): qty 1

## 2016-11-11 MED ORDER — ENOXAPARIN SODIUM 40 MG/0.4ML ~~LOC~~ SOLN
40.0000 mg | SUBCUTANEOUS | Status: DC
  Administered 2016-11-12: 40 mg via SUBCUTANEOUS
  Filled 2016-11-11: qty 0.4

## 2016-11-11 MED ORDER — GLYCOPYRROLATE 0.2 MG/ML IJ SOLN
INTRAMUSCULAR | Status: AC
  Filled 2016-11-11: qty 3

## 2016-11-11 MED ORDER — LACTATED RINGERS IV SOLN
INTRAVENOUS | Status: DC
  Administered 2016-11-11: 1000 mL via INTRAVENOUS
  Administered 2016-11-11: 16:00:00 via INTRAVENOUS

## 2016-11-11 MED ORDER — FENTANYL CITRATE (PF) 100 MCG/2ML IJ SOLN
INTRAMUSCULAR | Status: DC | PRN
  Administered 2016-11-11 (×2): 25 ug via INTRAVENOUS
  Administered 2016-11-11 (×2): 50 ug via INTRAVENOUS
  Administered 2016-11-11 (×2): 25 ug via INTRAVENOUS
  Administered 2016-11-11: 50 ug via INTRAVENOUS

## 2016-11-11 MED ORDER — DEXTROSE 5 % IV SOLN
2.0000 g | INTRAVENOUS | Status: DC
  Filled 2016-11-11: qty 2

## 2016-11-11 MED ORDER — SUCCINYLCHOLINE CHLORIDE 20 MG/ML IJ SOLN
INTRAMUSCULAR | Status: DC | PRN
  Administered 2016-11-11: 160 mg via INTRAVENOUS

## 2016-11-11 MED ORDER — ONDANSETRON HCL 4 MG/2ML IJ SOLN: 4.0000 mg | Freq: Four times a day (QID) | INTRAMUSCULAR | Status: DC | PRN

## 2016-11-11 MED ORDER — ACETAMINOPHEN 325 MG PO TABS: 650.0000 mg | ORAL_TABLET | Freq: Four times a day (QID) | ORAL | Status: DC | PRN

## 2016-11-11 MED ORDER — HYDROMORPHONE HCL 1 MG/ML IJ SOLN
INTRAMUSCULAR | Status: AC
  Filled 2016-11-11: qty 0.5

## 2016-11-11 MED ORDER — DIPHENHYDRAMINE HCL 50 MG/ML IJ SOLN
INTRAMUSCULAR | Status: AC
  Filled 2016-11-11: qty 1

## 2016-11-11 MED ORDER — ROCURONIUM BROMIDE 100 MG/10ML IV SOLN
INTRAVENOUS | Status: DC | PRN
  Administered 2016-11-11: 5 mg via INTRAVENOUS
  Administered 2016-11-11: 25 mg via INTRAVENOUS

## 2016-11-11 MED ORDER — DIPHENHYDRAMINE HCL 25 MG PO CAPS: 25.0000 mg | ORAL_CAPSULE | Freq: Four times a day (QID) | ORAL | Status: DC | PRN

## 2016-11-11 MED ORDER — POVIDONE-IODINE 10 % EX OINT
TOPICAL_OINTMENT | CUTANEOUS | Status: AC
  Filled 2016-11-11: qty 1

## 2016-11-11 MED ORDER — LIDOCAINE HCL (CARDIAC) 10 MG/ML IV SOLN
INTRAVENOUS | Status: DC | PRN
  Administered 2016-11-11: 50 mg via INTRAVENOUS

## 2016-11-11 MED ORDER — NEOSTIGMINE METHYLSULFATE 10 MG/10ML IV SOLN
INTRAVENOUS | Status: AC
  Filled 2016-11-11: qty 1

## 2016-11-11 MED ORDER — KETOROLAC TROMETHAMINE 30 MG/ML IJ SOLN
INTRAMUSCULAR | Status: AC
  Filled 2016-11-11: qty 1

## 2016-11-11 MED ORDER — ACETAMINOPHEN 650 MG RE SUPP: 650.0000 mg | Freq: Four times a day (QID) | RECTAL | Status: DC | PRN

## 2016-11-11 MED ORDER — DIPHENHYDRAMINE HCL 50 MG/ML IJ SOLN: 25.0000 mg | Freq: Four times a day (QID) | INTRAMUSCULAR | Status: DC | PRN

## 2016-11-11 MED ORDER — DEXTROSE 5 % IV SOLN
1.0000 g | Freq: Once | INTRAVENOUS | Status: AC
  Administered 2016-11-11: 1 g via INTRAVENOUS
  Filled 2016-11-11: qty 10

## 2016-11-11 MED ORDER — DIPHENHYDRAMINE HCL 50 MG/ML IJ SOLN
INTRAMUSCULAR | Status: DC | PRN
  Administered 2016-11-11: 25 mg via INTRAVENOUS

## 2016-11-11 MED ORDER — LORAZEPAM 2 MG/ML IJ SOLN
1.0000 mg | INTRAMUSCULAR | Status: DC | PRN
  Administered 2016-11-11 – 2016-11-12 (×2): 1 mg via INTRAVENOUS
  Filled 2016-11-11 (×2): qty 1

## 2016-11-11 MED ORDER — OXYCODONE-ACETAMINOPHEN 5-325 MG PO TABS: 1.0000 | ORAL_TABLET | ORAL | Status: DC | PRN

## 2016-11-11 MED ORDER — DIPHENHYDRAMINE HCL 50 MG/ML IJ SOLN: 12.5000 mg | Freq: Four times a day (QID) | INTRAMUSCULAR | Status: DC | PRN

## 2016-11-11 MED ORDER — DIPHENHYDRAMINE HCL 12.5 MG/5ML PO ELIX: 12.5000 mg | ORAL_SOLUTION | Freq: Four times a day (QID) | ORAL | Status: DC | PRN

## 2016-11-11 MED ORDER — BUPIVACAINE HCL (PF) 0.5 % IJ SOLN
INTRAMUSCULAR | Status: DC | PRN
  Administered 2016-11-11: 10 mL

## 2016-11-11 MED ORDER — IOPAMIDOL (ISOVUE-300) INJECTION 61%
100.0000 mL | Freq: Once | INTRAVENOUS | Status: AC | PRN
  Administered 2016-11-11: 100 mL via INTRAVENOUS

## 2016-11-11 MED ORDER — MIDAZOLAM HCL 2 MG/2ML IJ SOLN
INTRAMUSCULAR | Status: AC
  Filled 2016-11-11: qty 2

## 2016-11-11 MED ORDER — KETOROLAC TROMETHAMINE 30 MG/ML IJ SOLN
30.0000 mg | Freq: Once | INTRAMUSCULAR | Status: AC
  Administered 2016-11-11: 30 mg via INTRAVENOUS

## 2016-11-11 MED ORDER — NALOXONE HCL 0.4 MG/ML IJ SOLN: 0.4000 mg | INTRAMUSCULAR | Status: DC | PRN

## 2016-11-11 MED ORDER — FENTANYL CITRATE (PF) 250 MCG/5ML IJ SOLN
INTRAMUSCULAR | Status: AC
  Filled 2016-11-11: qty 5

## 2016-11-11 MED ORDER — GLYCOPYRROLATE 0.2 MG/ML IJ SOLN
INTRAMUSCULAR | Status: DC | PRN
  Administered 2016-11-11: 0.6 mg via INTRAVENOUS

## 2016-11-11 MED ORDER — HYDROMORPHONE HCL 1 MG/ML IJ SOLN: 1.0000 mg | INTRAMUSCULAR | Status: DC | PRN

## 2016-11-11 MED ORDER — MIDAZOLAM HCL 2 MG/2ML IJ SOLN
0.5000 mg | INTRAMUSCULAR | Status: DC | PRN
Start: 2016-11-11 — End: 2016-11-11
  Administered 2016-11-11: 2 mg via INTRAVENOUS

## 2016-11-11 MED ORDER — CHLORHEXIDINE GLUCONATE CLOTH 2 % EX PADS: 6.0000 | MEDICATED_PAD | Freq: Once | CUTANEOUS | Status: DC

## 2016-11-11 MED ORDER — SODIUM CHLORIDE 0.9 % IR SOLN
Status: DC | PRN
  Administered 2016-11-11: 1000 mL

## 2016-11-11 MED ORDER — SIMETHICONE 80 MG PO CHEW: 40.0000 mg | CHEWABLE_TABLET | Freq: Four times a day (QID) | ORAL | Status: DC | PRN

## 2016-11-11 MED ORDER — SODIUM CHLORIDE 0.9 % IV BOLUS (SEPSIS)
1000.0000 mL | Freq: Once | INTRAVENOUS | Status: AC
  Administered 2016-11-11: 1000 mL via INTRAVENOUS

## 2016-11-11 MED ORDER — SODIUM CHLORIDE 0.9% FLUSH: 9.0000 mL | INTRAVENOUS | Status: DC | PRN

## 2016-11-11 MED ORDER — FENTANYL 40 MCG/ML IV SOLN: INTRAVENOUS | Status: DC

## 2016-11-11 MED ORDER — DEXAMETHASONE SODIUM PHOSPHATE 10 MG/ML IJ SOLN
INTRAMUSCULAR | Status: DC | PRN
  Administered 2016-11-11: 4 mg via INTRAVENOUS

## 2016-11-11 MED ORDER — NEOSTIGMINE METHYLSULFATE 10 MG/10ML IV SOLN
INTRAVENOUS | Status: DC | PRN
  Administered 2016-11-11: 3 mg via INTRAVENOUS

## 2016-11-11 MED ORDER — ONDANSETRON HCL 4 MG/2ML IJ SOLN
4.0000 mg | Freq: Once | INTRAMUSCULAR | Status: AC
  Administered 2016-11-11: 4 mg via INTRAVENOUS

## 2016-11-11 MED ORDER — ONDANSETRON 4 MG PO TBDP: 4.0000 mg | ORAL_TABLET | Freq: Four times a day (QID) | ORAL | Status: DC | PRN

## 2016-11-11 MED ORDER — ONDANSETRON HCL 4 MG/2ML IJ SOLN
INTRAMUSCULAR | Status: AC
Start: 2016-11-11 — End: 2016-11-11
  Filled 2016-11-11: qty 2

## 2016-11-11 MED ORDER — HYDROMORPHONE 1 MG/ML IV SOLN
INTRAVENOUS | Status: DC
Start: 2016-11-11 — End: 2016-11-13
  Administered 2016-11-11: 18:00:00 via INTRAVENOUS
  Administered 2016-11-12: 3.6 mg via INTRAVENOUS
  Administered 2016-11-12: 0.9 mg via INTRAVENOUS
  Administered 2016-11-12: 5.4 mg via INTRAVENOUS
  Administered 2016-11-13: 2.7 mg via INTRAVENOUS
  Administered 2016-11-13: 1.2 mg via INTRAVENOUS

## 2016-11-11 MED ORDER — HYDROMORPHONE HCL 1 MG/ML IJ SOLN
0.2500 mg | INTRAMUSCULAR | Status: DC | PRN
  Administered 2016-11-11 (×4): 0.5 mg via INTRAVENOUS
  Filled 2016-11-11 (×2): qty 0.5

## 2016-11-11 MED ORDER — DEXAMETHASONE SODIUM PHOSPHATE 4 MG/ML IJ SOLN
INTRAMUSCULAR | Status: AC
  Filled 2016-11-11: qty 1

## 2016-11-11 MED ORDER — LACTATED RINGERS IV SOLN
INTRAVENOUS | Status: DC
  Administered 2016-11-12 – 2016-11-13 (×3): via INTRAVENOUS

## 2016-11-11 MED ORDER — PROPOFOL 10 MG/ML IV BOLUS
INTRAVENOUS | Status: DC | PRN
  Administered 2016-11-11: 50 mg via INTRAVENOUS
  Administered 2016-11-11: 180 mg via INTRAVENOUS

## 2016-11-11 MED ORDER — HYDROMORPHONE 1 MG/ML IV SOLN
INTRAVENOUS | Status: AC
  Filled 2016-11-11: qty 25

## 2016-11-11 SURGICAL SUPPLY — 44 items
BAG HAMPER (MISCELLANEOUS) ×3 IMPLANT
BAG SPEC RTRVL LRG 6X4 10 (ENDOMECHANICALS) ×1
CHLORAPREP W/TINT 26ML (MISCELLANEOUS) ×3 IMPLANT
CLOTH BEACON ORANGE TIMEOUT ST (SAFETY) ×3 IMPLANT
COVER LIGHT HANDLE STERIS (MISCELLANEOUS) ×6 IMPLANT
CUTTER FLEX LINEAR 45M (STAPLE) ×3 IMPLANT
DECANTER SPIKE VIAL GLASS SM (MISCELLANEOUS) ×3 IMPLANT
ELECT REM PT RETURN 9FT ADLT (ELECTROSURGICAL) ×3
ELECTRODE REM PT RTRN 9FT ADLT (ELECTROSURGICAL) ×1 IMPLANT
EVACUATOR SMOKE 8.L (FILTER) ×3 IMPLANT
FORMALIN 10 PREFIL 120ML (MISCELLANEOUS) ×3 IMPLANT
GLOVE BIOGEL PI IND STRL 7.0 (GLOVE) ×2 IMPLANT
GLOVE BIOGEL PI INDICATOR 7.0 (GLOVE) ×6
GLOVE SURG SS PI 6.5 STRL IVOR (GLOVE) ×2 IMPLANT
GLOVE SURG SS PI 7.5 STRL IVOR (GLOVE) ×3 IMPLANT
GOWN STRL REUS W/ TWL XL LVL3 (GOWN DISPOSABLE) ×1 IMPLANT
GOWN STRL REUS W/TWL LRG LVL3 (GOWN DISPOSABLE) ×3 IMPLANT
GOWN STRL REUS W/TWL XL LVL3 (GOWN DISPOSABLE) ×6
INST SET LAPROSCOPIC AP (KITS) ×3 IMPLANT
KIT ROOM TURNOVER APOR (KITS) ×3 IMPLANT
MANIFOLD NEPTUNE II (INSTRUMENTS) ×3 IMPLANT
NDL INSUFFLATION 14GA 120MM (NEEDLE) ×1 IMPLANT
NEEDLE INSUFFLATION 14GA 120MM (NEEDLE) ×3 IMPLANT
NS IRRIG 1000ML POUR BTL (IV SOLUTION) ×3 IMPLANT
PACK LAP CHOLE LZT030E (CUSTOM PROCEDURE TRAY) ×3 IMPLANT
PAD ARMBOARD 7.5X6 YLW CONV (MISCELLANEOUS) ×3 IMPLANT
PENCIL HANDSWITCHING (ELECTRODE) ×3 IMPLANT
POUCH SPECIMEN RETRIEVAL 10MM (ENDOMECHANICALS) ×3 IMPLANT
RELOAD 45 VASCULAR/THIN (ENDOMECHANICALS) ×3 IMPLANT
RELOAD STAPLE 45 2.5 WHT GRN (ENDOMECHANICALS) IMPLANT
SET BASIN LINEN APH (SET/KITS/TRAYS/PACK) ×3 IMPLANT
SHEARS HARMONIC ACE PLUS 36CM (ENDOMECHANICALS) ×3 IMPLANT
SPONGE GAUZE 2X2 8PLY STER LF (GAUZE/BANDAGES/DRESSINGS) ×3
SPONGE GAUZE 2X2 8PLY STRL LF (GAUZE/BANDAGES/DRESSINGS) ×6 IMPLANT
STAPLER VISISTAT (STAPLE) ×3 IMPLANT
SUT VICRYL 0 UR6 27IN ABS (SUTURE) ×3 IMPLANT
TAPE CLOTH SURG 4X10 WHT LF (GAUZE/BANDAGES/DRESSINGS) ×2 IMPLANT
TRAY FOLEY CATH SILVER 16FR (SET/KITS/TRAYS/PACK) ×3 IMPLANT
TROCAR ENDO BLADELESS 11MM (ENDOMECHANICALS) ×3 IMPLANT
TROCAR ENDO BLADELESS 12MM (ENDOMECHANICALS) ×3 IMPLANT
TROCAR XCEL NON-BLD 5MMX100MML (ENDOMECHANICALS) ×3 IMPLANT
TUBING INSUFFLATION (TUBING) ×3 IMPLANT
WARMER LAPAROSCOPE (MISCELLANEOUS) ×3 IMPLANT
YANKAUER SUCT 12FT TUBE ARGYLE (SUCTIONS) ×3 IMPLANT

## 2016-11-11 NOTE — H&P (Signed)
Edward Rivera is an 31 y.o. male.   Chief Complaint: Right lower quadrant abdominal pain HPI: Patient is a 31 year old white male with a history of nonspecific abdominal pain for the last month started having right lower quadrant abdominal pain yesterday evening. He underwent a CT scan of the abdomen today which revealed possible early acute appendicitis. He states this pain is different than the previous pain for which he has undergone a workup including a colonoscopy. He does have mild generalized malaise with decreased appetite. He has also been having intermittent loose bowel movements.  Past Medical History:  Diagnosis Date  . Allergy   . Celiac disease   . GERD (gastroesophageal reflux disease)   . Seizures ([place])    when patient was 31 years old    Past Surgical History:  Procedure Laterality Date  . COLONOSCOPY  06/2016  . HAND SURGERY Right   . UPPER GASTROINTESTINAL ENDOSCOPY      Family History  Problem Relation Age of Onset  . Hypertension Father   . Diabetes Maternal Grandmother   . Colon cancer Maternal Grandmother   . Diabetes Maternal Grandfather   . Diabetes Paternal Grandmother   . Breast cancer Paternal Grandmother   . Diabetes Paternal Grandfather   . Hypertension Sister   . Heart disease Paternal Uncle   . Esophageal cancer Neg Hx   . Pancreatic cancer Neg Hx   . Prostate cancer Neg Hx   . Rectal cancer Neg Hx   . Stomach cancer Neg Hx    Social History:  reports that he has never smoked. He has never used smokeless tobacco. He reports that he drinks about 4.2 oz of alcohol per week . He reports that he does not use drugs.  Allergies: No Known Allergies  Facility-Administered Medications Prior to Admission  Medication Dose Route Frequency Provider Last Rate Last Dose  . 0.9 %  sodium chloride infusion  500 mL Intravenous Continuous Manus Gunning, MD      . 0.9 %  sodium chloride infusion  500 mL Intravenous Continuous Manus Gunning, MD        Medications Prior to Admission  Medication Sig Dispense Refill  . cetirizine (ZYRTEC) 10 MG tablet Take 1 tablet (10 mg total) by mouth daily. 30 tablet 11  . dicyclomine (BENTYL) 20 MG tablet Take 1 tablet (20 mg total) by mouth 3 (three) times daily before meals. (Patient taking differently: Take 20 mg by mouth daily as needed for spasms. ) 90 tablet 3  . fluticasone (FLONASE) 50 MCG/ACT nasal spray Place 2 sprays into both nostrils daily. 16 g 11  . Multiple Vitamin (MULTIVITAMIN) tablet Take 1 tablet by mouth daily.    Marland Kitchen omeprazole (PRILOSEC) 20 MG capsule Take 1 capsule (20 mg total) by mouth daily. Take 1 capsule (29m) by mouth daily for one month. 30 capsule 3    Results for orders placed or performed during the hospital encounter of 11/11/16 (from the past 48 hour(s))  Basic metabolic panel     Status: Abnormal   Collection Time: 11/11/16  1:42 PM  Result Value Ref Range   Sodium 135 135 - 145 mmol/L   Potassium 3.9 3.5 - 5.1 mmol/L   Chloride 99 (L) 101 - 111 mmol/L   CO2 27 22 - 32 mmol/L   Glucose, Bld 97 65 - 99 mg/dL   BUN 7 6 - 20 mg/dL   Creatinine, Ser 0.97 0.61 - 1.24 mg/dL   Calcium 9.2 8.9 -  10.3 mg/dL   GFR calc non Af Amer >60 >60 mL/min   GFR calc Af Amer >60 >60 mL/min    Comment: (NOTE) The eGFR has been calculated using the CKD EPI equation. This calculation has not been validated in all clinical situations. eGFR's persistently <60 mL/min signify possible Chronic Kidney Disease.    Anion gap 9 5 - 15  CBC with Differential     Status: Abnormal   Collection Time: 11/11/16  1:42 PM  Result Value Ref Range   WBC 9.3 4.0 - 10.5 K/uL   RBC 5.12 4.22 - 5.81 MIL/uL   Hemoglobin 16.3 13.0 - 17.0 g/dL   HCT 45.0 39.0 - 52.0 %   MCV 87.9 78.0 - 100.0 fL   MCH 31.8 26.0 - 34.0 pg   MCHC 36.2 (H) 30.0 - 36.0 g/dL   RDW 12.7 11.5 - 15.5 %   Platelets 313 150 - 400 K/uL   Neutrophils Relative % 73 %   Neutro Abs 6.8 1.7 - 7.7 K/uL   Lymphocytes Relative  16 %   Lymphs Abs 1.5 0.7 - 4.0 K/uL   Monocytes Relative 9 %   Monocytes Absolute 0.8 0.1 - 1.0 K/uL   Eosinophils Relative 2 %   Eosinophils Absolute 0.2 0.0 - 0.7 K/uL   Basophils Relative 0 %   Basophils Absolute 0.0 0.0 - 0.1 K/uL   Dg Chest 2 View  Result Date: 11/11/2016 CLINICAL DATA:  Cough, history of celiac disease and gastroesophageal reflux. Nonsmoker. EXAM: CHEST  2 VIEW COMPARISON:  None in PACs FINDINGS: The lungs are adequately inflated. There is no focal infiltrate. There is no pleural effusion. The heart and pulmonary vascularity are normal. The mediastinum is normal in width. The trachea is midline. The bony thorax is unremarkable. IMPRESSION: There is no pneumonia nor other acute cardiopulmonary abnormality. Electronically Signed   By: David  Martinique M.D.   On: 11/11/2016 09:25   Ct Abdomen Pelvis W Contrast  Result Date: 11/11/2016 CLINICAL DATA:  Abd pain, 4 weeks after Cscope, guarding and firmness on exam EXAM: CT ABDOMEN AND PELVIS WITH CONTRAST TECHNIQUE: Multidetector CT imaging of the abdomen and pelvis was performed using the standard protocol following bolus administration of intravenous contrast. CONTRAST:  136m ISOVUE-300 IOPAMIDOL (ISOVUE-300) INJECTION 61% COMPARISON:  None. FINDINGS: Lower chest: No acute abnormality. Hepatobiliary: Diffuse hepatic steatosis.  Normal gallbladder. Pancreas: Unremarkable pancreas. Spleen: Unremarkable. Adrenals/Urinary Tract: Normal adrenal glands. Normal kidneys. Normal bladder. Stomach/Bowel: The appendix is markedly distended measuring up to 1.9 cm and filled with low density material. There is stranding adjacent to the appendix. No evidence of small-bowel obstruction. No obvious mass in the colon. Normal stomach. Vascular/Lymphatic: No evidence of aortic aneurysm. No abnormal retroperitoneal adenopathy. Small right iliac lymph nodes. Reproductive: Normal prostate. Other: No free-fluid.  No free intraperitoneal gas.  No abscess.  Musculoskeletal: No vertebral compression deformity. IMPRESSION: The appendix is distended and filled with low-density material. There is some stranding adjacent to the appendix. Differential diagnosis includes acute appendicitis or mucocele of the appendix. And attempt to contact the referral position was unsuccessful. The patient will be transported to the emergency room. Electronically Signed   By: AMarybelle KillingsM.D.   On: 11/11/2016 13:01    Review of Systems  Constitutional: Positive for malaise/fatigue.  HENT: Negative.   Eyes: Negative.   Respiratory: Negative.   Cardiovascular: Negative.   Gastrointestinal: Positive for abdominal pain and diarrhea. Negative for vomiting.  Genitourinary: Negative.   Musculoskeletal: Negative.  Skin: Negative.   Neurological: Negative.   Endo/Heme/Allergies: Negative.   All other systems reviewed and are negative.   Blood pressure (!) 136/92, pulse 68, temperature 97.4 F (36.3 C), temperature source Oral, resp. rate 14, height _0  (1.88 m), weight 115.2 kg (254 lb), SpO2 98 %. Physical Exam  Constitutional: He is oriented to person, place, and time. He appears well-developed and well-nourished.  HENT:  Head: Normocephalic and atraumatic.  Neck: Normal range of motion. Neck supple.  Cardiovascular: Normal rate, regular rhythm and normal heart sounds.   Respiratory: Effort normal and breath sounds normal.  GI: Soft. He exhibits no distension. There is tenderness. There is rebound.  Tender in the right lower quadrant to deep palpation. No rigidity noted.  Musculoskeletal: Normal range of motion.  Neurological: He is alert and oriented to person, place, and time.  Skin: Skin is warm and dry.     Assessment/Plan Impression: Acute appendicitis Plan: Patient be taken to the operating room for a laparoscopic appendectomy. The risks and benefits of the procedure including bleeding, infection, and the possibility of an open procedure were fully  explained to the patient, who gave informed consent.  Jamesetta So, MD 11/11/2016, 3:26 PM

## 2016-11-11 NOTE — Transfer of Care (Signed)
Immediate Anesthesia Transfer of Care Note  Patient: Edward Rivera  Procedure(s) Performed: Procedure(s): APPENDECTOMY LAPAROSCOPIC (N/A)  Patient Location: PACU  Anesthesia Type:General  Level of Consciousness: awake and patient cooperative  Airway & Oxygen Therapy: Patient Spontanous Breathing and Patient connected to nasal cannula oxygen  Post-op Assessment: Report given to RN and Post -op Vital signs reviewed and stable  Post vital signs: Reviewed and stable  Last Vitals:  Vitals:   11/11/16 1530 11/11/16 1535  BP: 128/77 124/79  Pulse:    Resp: 15 16  Temp:      Last Pain:  Vitals:   11/11/16 1515  TempSrc: Oral  PainSc: 3       Patients Stated Pain Goal: 9 ([id number])  Complications: No apparent anesthesia complications

## 2016-11-11 NOTE — Anesthesia Procedure Notes (Signed)
Procedure Name: Intubation Date/Time: 11/11/2016 3:49 PM Performed by: Vista Deck Pre-anesthesia Checklist: Patient identified, Patient being monitored, Timeout performed, Emergency Drugs available and Suction available Patient Re-evaluated:Patient Re-evaluated prior to inductionOxygen Delivery Method: Circle System Utilized Preoxygenation: Pre-oxygenation with 100% oxygen Intubation Type: IV induction Ventilation: Mask ventilation without difficulty Laryngoscope Size: Miller and 2 Grade View: Grade I Tube type: Oral Tube size: 7.0 mm Number of attempts: 1 Airway Equipment and Method: Stylet and Oral airway Placement Confirmation: ETT inserted through vocal cords under direct vision,  positive ETCO2 and breath sounds checked- equal and bilateral Secured at: 23 cm Tube secured with: Tape Dental Injury: Teeth and Oropharynx as per pre-operative assessment

## 2016-11-11 NOTE — Patient Instructions (Addendum)
Great to see you!  Lets get you to call [facility] for a follow up, I will hopefully rule out the more urgent medical issues today with labs and a CT.

## 2016-11-11 NOTE — Op Note (Signed)
Patient:  Edward Rivera  DOB:  07-27-1986  MRN:  [id number]   Preop Diagnosis:  Acute appendicitis  Postop Diagnosis:  Same  Procedure:  Laparoscopic appendectomy  Surgeon:  Aviva Signs, M.D.  Anes:  Gen. endotracheal  Indications:  Patient is a 31 year old white male who presents with a less than 24-hour history of worsening right lower quadrant abdominal pain. CT scan of the abdomen revealed acute appendicitis with possible mucocele formation. The risks and benefits of the procedure including bleeding, infection, and the possibility of an open procedure were fully explained to the patient, who gave informed consent.  Procedure note:  The patient was placed in the supine position. After induction of general endotracheal anesthesia, the abdomen was prepped and draped using the usual sterile technique with DuraPrep. Surgical site confirmation was performed.  A supraumbilical incision was made down to the fascia. A Veress needle was introduced into the abdominal cavity and confirmation of placement was done using the saline drop test. The abdomen was then insufflated to 16 mmHg pressure. An 11 mm trocar was introduced into the abdominal cavity under direct visualization without difficulty. The patient was placed in deeper Trendelenburg position and an additional 12 mm trocar was placed in the suprapubic region and 5 mm trocar was placed in the left lower quadrant region. The appendix was visualized and there was some mucus-like material surrounding the midportion of the appendix. The distal half of the appendix was inflamed and swollen. There was no evidence of perforation. The mesoappendix was divided using the Harmonic scalpel. A vascular Endo GIA was placed across the base the appendix and fired. The appendix was then removed using an Endo Catch bag without difficulty. The staple line was inspected and noted to be within normal limits. There was no evidence of any mucus formation remaining in the  right lower quadrant region. All fluid and air were then evacuated from the abdominal cavity prior to the removal of the trochars.  All wounds were irrigated with normal saline. All wounds were injected with 0.5% Sensorcaine. The supraumbilical fascia was reapproximated using an 0 Vicryl interrupted suture. All skin incisions were closed using staples. Betadine ointment and dry sterile dressings were applied.  All tape and needle counts were correct at the end of the procedure. The patient was extubated in the operating room and transferred to PACU in stable condition.  Complications:  None  EBL:  Minimal  Specimen:  Appendix

## 2016-11-11 NOTE — Anesthesia Postprocedure Evaluation (Signed)
Anesthesia Post Note  Patient: Edward Rivera  Procedure(s) Performed: Procedure(s) (LRB): APPENDECTOMY LAPAROSCOPIC (N/A)  Patient location during evaluation: PACU Anesthesia Type: General Level of consciousness: awake Pain control: PCA started and IV Ativan given. Pain 8/10. Vital Signs Assessment: post-procedure vital signs reviewed and stable Respiratory status: spontaneous breathing and patient connected to nasal cannula oxygen Cardiovascular status: stable Anesthetic complications: no     Last Vitals:  Vitals:   11/11/16 1700 11/11/16 1715  BP: 136/83 123/76  Pulse: 86 (!) 57  Resp: (!) 21 16  Temp:      Last Pain:  Vitals:   11/11/16 1724  TempSrc:   PainSc: 9                  [name]

## 2016-11-11 NOTE — ED Provider Notes (Signed)
[facility] DEPT Provider Note   CSN: [id number] Arrival date & time: 11/11/16  1317  By signing my name below, I, Ryan Long, attest that this documentation has been prepared under the direction and in the presence of Sherwood Gambler, MD . Electronically Signed: Vergia Alcon, Scribe. 11/11/2016. 2:12 PM.  History   Chief Complaint Chief Complaint  Patient presents with  . Abdominal Pain    HPI Edward Rivera is a 31 y.o. male who presents to the ED complaining of lower abdominal pain. CT by PCP shows acute appendicitis today.  He has been experiencing constant abdominal pain for the past month but states pain worsened yesterday evening ~6:30pm. 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 That evening following his procedure is when the abdominal pain initially began. Pt went to [facility] ER  For these symptoms at that time and had a negative workup. Pt went to his PCP today and CT was ordered was then sent to this ED for further evaluation. Pt has associated symptoms of acute nausea and intermittent diarrhea. Pt denies current vomiting. No alleviating factors noted. He feels the pain is worse since last night and now has new nausea and anorexia.    The history is provided by the patient. No language interpreter was used.    Past Medical History:  Diagnosis Date  . Allergy   . Celiac disease   . GERD (gastroesophageal reflux disease)   . Seizures ([place])    when patient was 31 years old    Patient Active Problem List   Diagnosis Date Noted  . Celiac disease 03/04/2016  . Chronic diarrhea 03/04/2016    Past Surgical History:  Procedure Laterality Date  . COLONOSCOPY  06/2016  . HAND SURGERY Right   . UPPER GASTROINTESTINAL ENDOSCOPY         Home Medications    Prior to Admission medications   Medication Sig Start Date End Date Taking? Authorizing Provider  cetirizine (ZYRTEC) 10 MG tablet Take 1  tablet (10 mg total) by mouth daily. 08/05/16   Timmothy Euler, MD  dicyclomine (BENTYL) 20 MG tablet Take 1 tablet (20 mg total) by mouth 3 (three) times daily before meals. 04/04/16   Timmothy Euler, MD  fluticasone (FLONASE) 50 MCG/ACT nasal spray Place 2 sprays into both nostrils daily. 09/09/16   Timmothy Euler, MD  Multiple Vitamin (MULTIVITAMIN) tablet Take 1 tablet by mouth daily.    Historical Provider, MD  omeprazole (PRILOSEC) 20 MG capsule Take 1 capsule (20 mg total) by mouth daily. Take 1 capsule (20mg ) by mouth daily for one month. 07/15/16   Manus Gunning, MD    Family History Family History  Problem Relation Age of Onset  . Hypertension Father   . Diabetes Maternal Grandmother   . Colon cancer Maternal Grandmother   . Diabetes Maternal Grandfather   . Diabetes Paternal Grandmother   . Breast cancer Paternal Grandmother   . Diabetes Paternal Grandfather   . Hypertension Sister   . Heart disease Paternal Uncle   . Esophageal cancer Neg Hx   . Pancreatic cancer Neg Hx   . Prostate cancer Neg Hx   . Rectal cancer Neg Hx   . Stomach cancer Neg Hx     Social History Social History  Substance Use Topics  . Smoking status: Never Smoker  . Smokeless tobacco: Never Used  . Alcohol use 4.2 oz/week  7 Shots of liquor per week     Comment: 1 cocktail at night     Allergies   Patient has no known allergies.   Review of Systems Review of Systems  Constitutional: Positive for appetite change. Negative for fever.  Cardiovascular: Negative for chest pain.  Gastrointestinal: Positive for abdominal pain, diarrhea and nausea.  All other systems reviewed and are negative.    Physical Exam Updated Vital Signs BP 127/79   Pulse 67   Temp 97.9 F (36.6 C)   Resp 18   Ht 6\' 2"  (1.88 m)   Wt 254 lb (115.2 kg)   SpO2 99%   BMI 32.61 kg/m   Physical Exam  Constitutional: He is oriented to person, place, and time. He appears well-developed and  well-nourished.  HENT:  Head: Normocephalic and atraumatic.  Right Ear: External ear normal.  Left Ear: External ear normal.  Nose: Nose normal.  Eyes: Right eye exhibits no discharge. Left eye exhibits no discharge.  Neck: Neck supple.  Cardiovascular: Normal rate, regular rhythm and normal heart sounds.   Pulmonary/Chest: Effort normal and breath sounds normal.  Abdominal: Soft. There is tenderness (RLQ, suprabuic, LLQ tenderness).  Musculoskeletal: He exhibits no edema.  Neurological: He is alert and oriented to person, place, and time.  Skin: Skin is warm and dry.  Nursing note and vitals reviewed.    ED Treatments / Results  DIAGNOSTIC STUDIES:  Oxygen Saturation is 99% on RA, normal by my interpretation.    COORDINATION OF CARE:  2:00 PM Discussed treatment plan with pt at bedside and pt agreed to plan.   Labs (all labs ordered are listed, but only abnormal results are displayed) Labs Reviewed  BASIC METABOLIC PANEL - Abnormal; Notable for the following:       Result Value   Chloride 99 (*)    All other components within normal limits  CBC WITH DIFFERENTIAL/PLATELET - Abnormal; Notable for the following:    MCHC 36.2 (*)    All other components within normal limits    EKG  EKG Interpretation None       Radiology Dg Chest 2 View  Result Date: 11/11/2016 CLINICAL DATA:  Cough, history of celiac disease and gastroesophageal reflux. Nonsmoker. EXAM: CHEST  2 VIEW COMPARISON:  None in PACs FINDINGS: The lungs are adequately inflated. There is no focal infiltrate. There is no pleural effusion. The heart and pulmonary vascularity are normal. The mediastinum is normal in width. The trachea is midline. The bony thorax is unremarkable. IMPRESSION: There is no pneumonia nor other acute cardiopulmonary abnormality. Electronically Signed   By: David  Martinique M.D.   On: 11/11/2016 09:25   Ct Abdomen Pelvis W Contrast  Result Date: 11/11/2016 CLINICAL DATA:  Abd pain, 4  weeks after Cscope, guarding and firmness on exam EXAM: CT ABDOMEN AND PELVIS WITH CONTRAST TECHNIQUE: Multidetector CT imaging of the abdomen and pelvis was performed using the standard protocol following bolus administration of intravenous contrast. CONTRAST:  169mL ISOVUE-300 IOPAMIDOL (ISOVUE-300) INJECTION 61% COMPARISON:  None. FINDINGS: Lower chest: No acute abnormality. Hepatobiliary: Diffuse hepatic steatosis.  Normal gallbladder. Pancreas: Unremarkable pancreas. Spleen: Unremarkable. Adrenals/Urinary Tract: Normal adrenal glands. Normal kidneys. Normal bladder. Stomach/Bowel: The appendix is markedly distended measuring up to 1.9 cm and filled with low density material. There is stranding adjacent to the appendix. No evidence of small-bowel obstruction. No obvious mass in the colon. Normal stomach. Vascular/Lymphatic: No evidence of aortic aneurysm. No abnormal retroperitoneal adenopathy. Small right iliac lymph nodes.  Reproductive: Normal prostate. Other: No free-fluid.  No free intraperitoneal gas.  No abscess. Musculoskeletal: No vertebral compression deformity. IMPRESSION: The appendix is distended and filled with low-density material. There is some stranding adjacent to the appendix. Differential diagnosis includes acute appendicitis or mucocele of the appendix. And attempt to contact the referral position was unsuccessful. The patient will be transported to the emergency room. Electronically Signed   By: Marybelle Killings M.D.   On: 11/11/2016 13:01    Procedures Procedures (including critical care time)  Medications Ordered in ED Medications  sodium chloride 0.9 % bolus 1,000 mL (1,000 mLs Intravenous New Bag/Given 11/11/16 1401)     Initial Impression / Assessment and Plan / ED Course  I have reviewed the triage vital signs and the nursing notes.  Pertinent labs & imaging results that were available during my care of the patient were reviewed by me and considered in my medical decision  making (see chart for details).     Dr. Arnoldo Morale has viewed CT and given worsening pain and concerning CT will take to OR. IV rocephin given. Stable.   Final Clinical Impressions(s) / ED Diagnoses   Final diagnoses:  Acute appendicitis, unspecified acute appendicitis type    New Prescriptions New Prescriptions   No medications on file   I personally performed the services described in this documentation, which was scribed in my presence. The recorded information has been reviewed and is accurate.    Sherwood Gambler, MD 11/11/16 [phone]

## 2016-11-11 NOTE — Anesthesia Preprocedure Evaluation (Addendum)
Anesthesia Evaluation  Patient identified by MRN, date of birth, ID band Patient awake    Reviewed: Allergy & Precautions, NPO status , Patient's Chart, lab work & pertinent test results  Airway Mallampati: I  TM Distance: >3 FB Neck ROM: Full    Dental  (+) Poor Dentition, Chipped, Teeth Intact,    Pulmonary    breath sounds clear to auscultation       Cardiovascular negative cardio ROS   Rhythm:Regular Rate:Normal     Neuro/Psych Seizures - ( inactive for years),     GI/Hepatic GERD  ,  Endo/Other    Renal/GU      Musculoskeletal   Abdominal   Peds  Hematology   Anesthesia Other Findings   Reproductive/Obstetrics                            Anesthesia Physical Anesthesia Plan  ASA: II and emergent  Anesthesia Plan:    Post-op Pain Management:    Induction: Intravenous, Rapid sequence and Cricoid pressure planned  Airway Management Planned: Oral ETT  Additional Equipment:   Intra-op Plan:   Post-operative Plan: Extubation in OR  Informed Consent: I have reviewed the patients History and Physical, chart, labs and discussed the procedure including the risks, benefits and alternatives for the proposed anesthesia with the patient or authorized representative who has indicated his/her understanding and acceptance.     Plan Discussed with:   Anesthesia Plan Comments:         Anesthesia Quick Evaluation

## 2016-11-11 NOTE — ED Triage Notes (Signed)
Pt reports that he has had right lower pain for one month and went to see PCP. CT done today showing appendicitis

## 2016-11-11 NOTE — Progress Notes (Signed)
   HPI  Patient presents today here with abdominal pain.  Patient had a repeat colonoscopy in late December, 12/27. He states that that evening he had severe abdominal pain, subjective fever and nausea. He went to the emergency room at [facility] and was treated with IV fluids. He describes several plain films with no abnormalities.  He states since that time over the last 3-4 weeks he's had abdominal pain on a daily basis. He describes it as lower abdominal sharp pain with no radiation that last a few hours and comes on almost daily. He is tolerating food and fluids normally. He denies any fever, chills, sweats. His bowels habits are changing from constipation to loose stools every few days.  He also states that he's having shortness of breath with walking or any activities. He denies any chest pain.  He has a dry cough.   PMH: Smoking status noted ROS: Per HPI  Objective: BP 131/84   Pulse 88   Temp (!) 96.9 F (36.1 C) (Oral)   Ht '6\' 2"'$  (1.88 m)   Wt 254 lb (115.2 kg)   BMI 32.61 kg/m  Gen: NAD, alert, cooperative with exam HEENT: NCAT CV: RRR, good S1/S2, no murmur Resp: CTABL, no wheezes, non-labored Abd: Positive bowel sounds, tenderness throughout but greater in the bilateral lower quadrants than upper quadrants, guarding however reasonably soft. Ext: No edema, warm Neuro: Alert and oriented, No gross deficits  Assessment and plan:  # Lower abdominal pain Lower abdominal pain after the colonoscopy 3-4 weeks ago I discussed the patient is very unlikely he has any kind of perforation, however given his abdominal exam today I think it's most prudent to go ahead and check stat labs and a stat CT scan today. Recommended follow-up with his GI doctor as soon as he can be worked in.  # Dyspnea on exertion Unusual presentation, however states that he's had a dry cough and dyspnea since his colonoscopy. I think it's very unlikely that he has any sort of concern for cardiac  etiology Start workup with plain film today while he is waiting for his stat CT to be  arranged.  Note patient requests tramadol for abdominal pain. When questioned he states he's never taken tramadol. I recommended not taking pain medications for abdominal pain as this could cover up a life-threatening situation. He understands. [place] controlled substance database does not show any controlled substances in the last 6 months.  Orders Placed This Encounter  Procedures  . CT Abdomen Pelvis W Contrast    Standing Status:   Future    Standing Expiration Date:   02/09/2018    Order Specific Question:   If indicated for the ordered procedure, I authorize the administration of contrast media per Radiology protocol    Answer:   Yes    Order Specific Question:   Reason for Exam (SYMPTOM  OR DIAGNOSIS REQUIRED)    Answer:   Abd pain, 4 weeks after Cscope, guarding and firmness on exam    Order Specific Question:   Preferred imaging location?    Answer:   [facility] [place], MD [facility] 11/11/2016, 9:13 AM

## 2016-11-12 ENCOUNTER — Telehealth: Payer: Self-pay | Admitting: Family Medicine

## 2016-11-12 DIAGNOSIS — R1031 Right lower quadrant pain: Secondary | ICD-10-CM | POA: Diagnosis not present

## 2016-11-12 DIAGNOSIS — Z79899 Other long term (current) drug therapy: Secondary | ICD-10-CM | POA: Diagnosis not present

## 2016-11-12 DIAGNOSIS — K219 Gastro-esophageal reflux disease without esophagitis: Secondary | ICD-10-CM | POA: Diagnosis present

## 2016-11-12 DIAGNOSIS — K3580 Unspecified acute appendicitis: Secondary | ICD-10-CM | POA: Diagnosis present

## 2016-11-12 DIAGNOSIS — K900 Celiac disease: Secondary | ICD-10-CM | POA: Diagnosis present

## 2016-11-12 LAB — CBC
HEMATOCRIT: 42.9 % (ref 39.0–52.0)
Hemoglobin: 15.6 g/dL (ref 13.0–17.0)
MCH: 32.1 pg (ref 26.0–34.0)
MCHC: 36.4 g/dL — AB (ref 30.0–36.0)
MCV: 88.3 fL (ref 78.0–100.0)
Platelets: 338 10*3/uL (ref 150–400)
RBC: 4.86 MIL/uL (ref 4.22–5.81)
RDW: 12.6 % (ref 11.5–15.5)
WBC: 17.1 10*3/uL — ABNORMAL HIGH (ref 4.0–10.5)

## 2016-11-12 LAB — BASIC METABOLIC PANEL
Anion gap: 10 (ref 5–15)
BUN: 8 mg/dL (ref 6–20)
CALCIUM: 8.8 mg/dL — AB (ref 8.9–10.3)
CHLORIDE: 98 mmol/L — AB (ref 101–111)
CO2: 24 mmol/L (ref 22–32)
CREATININE: 0.88 mg/dL (ref 0.61–1.24)
GFR calc non Af Amer: >60 mL/min (ref >60)
Glucose, Bld: 121 mg/dL — ABNORMAL HIGH (ref 65–99)
Potassium: 3.9 mmol/L (ref 3.5–5.1)
Sodium: 132 mmol/L — ABNORMAL LOW (ref 135–145)

## 2016-11-12 MED ORDER — PIPERACILLIN-TAZOBACTAM 3.375 G IVPB
3.3750 g | Freq: Three times a day (TID) | INTRAVENOUS | Status: DC
  Administered 2016-11-12 – 2016-11-13 (×4): 3.375 g via INTRAVENOUS
  Filled 2016-11-12 (×4): qty 50

## 2016-11-12 MED ORDER — INFLUENZA VAC SPLIT QUAD 0.5 ML IM SUSY
0.5000 mL | PREFILLED_SYRINGE | INTRAMUSCULAR | Status: AC
  Administered 2016-11-13: 0.5 mL via INTRAMUSCULAR
  Filled 2016-11-12: qty 0.5

## 2016-11-12 NOTE — Progress Notes (Signed)
1 Day Post-Op  Subjective: Patient did not sleep well overnight. He does have incisional pain.  Objective: Vital signs in last 24 hours: Temp:  [96.9 F (36.1 C)-98.9 F (37.2 C)] 97.6 F (36.4 C) (01/24 0644) Pulse Rate:  [57-126] 110 (01/24 0644) Resp:  [13-27] 20 (01/24 0644) BP: (109-154)/(52-96) 128/52 (01/24 0644) SpO2:  [93 %-100 %] 98 % (01/24 0644) Weight:  [115.2 kg (254 lb)] 115.2 kg (254 lb) (01/23 1515) Last BM Date: 11/09/16  Intake/Output from previous day: 01/23 0701 - 01/24 0700 In: 1300 [I.V.:1300] Out: 605 [Urine:600; Blood:5] Intake/Output this shift: No intake/output data recorded.  General appearance: alert, cooperative and no distress Resp: clear to auscultation bilaterally Cardio: regular rate and rhythm, S1, S2 normal, no murmur, click, rub or gallop GI: Soft, dressings dry and intact. Occasional bowel sounds appreciated. No rigidity noted.  Lab Results:   Recent Labs  11/11/16 1342 11/12/16 0424  WBC 9.3 17.1*  HGB 16.3 15.6  HCT 45.0 42.9  PLT 313 338   BMET  Recent Labs  11/11/16 1342 11/12/16 0424  NA 135 132*  K 3.9 3.9  CL 99* 98*  CO2 27 24  GLUCOSE 97 121*  BUN 7 8  CREATININE 0.97 0.88  CALCIUM 9.2 8.8*   PT/INR No results for input(s): LABPROT, INR in the last 72 hours.  Studies/Results: Dg Chest 2 View  Result Date: 11/11/2016 CLINICAL DATA:  Cough, history of celiac disease and gastroesophageal reflux. Nonsmoker. EXAM: CHEST  2 VIEW COMPARISON:  None in PACs FINDINGS: The lungs are adequately inflated. There is no focal infiltrate. There is no pleural effusion. The heart and pulmonary vascularity are normal. The mediastinum is normal in width. The trachea is midline. The bony thorax is unremarkable. IMPRESSION: There is no pneumonia nor other acute cardiopulmonary abnormality. Electronically Signed   By: David  Martinique M.D.   On: 11/11/2016 09:25   Ct Abdomen Pelvis W Contrast  Result Date: 11/11/2016 CLINICAL DATA:   Abd pain, 4 weeks after Cscope, guarding and firmness on exam EXAM: CT ABDOMEN AND PELVIS WITH CONTRAST TECHNIQUE: Multidetector CT imaging of the abdomen and pelvis was performed using the standard protocol following bolus administration of intravenous contrast. CONTRAST:  152mL ISOVUE-300 IOPAMIDOL (ISOVUE-300) INJECTION 61% COMPARISON:  None. FINDINGS: Lower chest: No acute abnormality. Hepatobiliary: Diffuse hepatic steatosis.  Normal gallbladder. Pancreas: Unremarkable pancreas. Spleen: Unremarkable. Adrenals/Urinary Tract: Normal adrenal glands. Normal kidneys. Normal bladder. Stomach/Bowel: The appendix is markedly distended measuring up to 1.9 cm and filled with low density material. There is stranding adjacent to the appendix. No evidence of small-bowel obstruction. No obvious mass in the colon. Normal stomach. Vascular/Lymphatic: No evidence of aortic aneurysm. No abnormal retroperitoneal adenopathy. Small right iliac lymph nodes. Reproductive: Normal prostate. Other: No free-fluid.  No free intraperitoneal gas.  No abscess. Musculoskeletal: No vertebral compression deformity. IMPRESSION: The appendix is distended and filled with low-density material. There is some stranding adjacent to the appendix. Differential diagnosis includes acute appendicitis or mucocele of the appendix. And attempt to contact the referral position was unsuccessful. The patient will be transported to the emergency room. Electronically Signed   By: Marybelle Killings M.D.   On: 11/11/2016 13:01    Anti-infectives: Anti-infectives    Start     Dose/Rate Route Frequency Ordered Stop   11/12/16 1200  cefTRIAXone (ROCEPHIN) 2 g in dextrose 5 % 50 mL IVPB  Status:  Discontinued     2 g 100 mL/hr over 30 Minutes Intravenous Every 24  hours 11/11/16 1726 11/12/16 0652   11/12/16 0700  piperacillin-tazobactam (ZOSYN) IVPB 3.375 g     3.375 g 12.5 mL/hr over 240 Minutes Intravenous Every 8 hours 11/12/16 0652     11/11/16 1500   cefTRIAXone (ROCEPHIN) 1 g in dextrose 5 % 50 mL IVPB     1 g 100 mL/hr over 30 Minutes Intravenous  Once 11/11/16 1447 11/11/16 1602      Assessment/Plan: s/p Procedure(s): APPENDECTOMY LAPAROSCOPIC Impression: Pain control still an issue. This started immediately in the PACU. He does have a leukocytosis, Price secondary to leukemoid reaction. We will switch to Zosyn IV. We will continue to monitor for 24 hours.  LOS: 0 days    Lloyd Cullinan A 11/12/2016

## 2016-11-12 NOTE — Anesthesia Postprocedure Evaluation (Signed)
Anesthesia Post Note  Patient: Edward Rivera  Procedure(s) Performed: Procedure(s) (LRB): APPENDECTOMY LAPAROSCOPIC (N/A)  Patient location during evaluation: Nursing Unit Anesthesia Type: General Level of consciousness: awake and alert and oriented Pain management: satisfactory to patient (PCA  and dilaudid 1 q4 hrs.) Vital Signs Assessment: post-procedure vital signs reviewed and stable Respiratory status: spontaneous breathing Cardiovascular status: stable Postop Assessment: no signs of nausea or vomiting and adequate PO intake Anesthetic complications: no     Last Vitals:  Vitals:   11/12/16 0959 11/12/16 1245  BP: 114/69   Pulse: (!) 109   Resp: 18 16  Temp: 37 C     Last Pain:  Vitals:   11/12/16 1245  TempSrc:   PainSc: 6                  Anhthu Perdew

## 2016-11-12 NOTE — Addendum Note (Signed)
Addendum  created 11/12/16 1441 by Ollen Bowl, CRNA   Sign clinical note

## 2016-11-13 ENCOUNTER — Encounter (HOSPITAL_COMMUNITY): Payer: Self-pay | Admitting: General Surgery

## 2016-11-13 LAB — BASIC METABOLIC PANEL
Anion gap: 7 (ref 5–15)
BUN: 8 mg/dL (ref 6–20)
CALCIUM: 8.4 mg/dL — AB (ref 8.9–10.3)
CO2: 28 mmol/L (ref 22–32)
Chloride: 101 mmol/L (ref 101–111)
Creatinine, Ser: 1.04 mg/dL (ref 0.61–1.24)
GFR calc Af Amer: >60 mL/min (ref >60)
Glucose, Bld: 95 mg/dL (ref 65–99)
POTASSIUM: 3.4 mmol/L — AB (ref 3.5–5.1)
SODIUM: 136 mmol/L (ref 135–145)

## 2016-11-13 LAB — CBC
HEMATOCRIT: 37.7 % — AB (ref 39.0–52.0)
Hemoglobin: 13.3 g/dL (ref 13.0–17.0)
MCH: 31.9 pg (ref 26.0–34.0)
MCHC: 35.3 g/dL (ref 30.0–36.0)
MCV: 90.4 fL (ref 78.0–100.0)
PLATELETS: 259 10*3/uL (ref 150–400)
RBC: 4.17 MIL/uL — ABNORMAL LOW (ref 4.22–5.81)
RDW: 13.2 % (ref 11.5–15.5)
WBC: 8 10*3/uL (ref 4.0–10.5)

## 2016-11-13 MED ORDER — OXYCODONE-ACETAMINOPHEN 7.5-325 MG PO TABS: 1.0000 | ORAL_TABLET | ORAL | 0 refills | Status: DC | PRN

## 2016-11-13 NOTE — Discharge Summary (Signed)
Physician Discharge Summary  Patient ID: Edward Rivera MRN: [id number] DOB/AGE: Oct 13, 1986 31 y.o.  Admit date: 11/11/2016 Discharge date: 11/13/2016  Admission Diagnoses:Acute appendicitis  Discharge Diagnoses: Same Active Problems:   Acute appendicitis   Discharged Condition: good  Hospital Course: Patient is a 31 year old white male who presented to the emergency room with a 24-hour history of worsening right lower quadrant abdominal pain. He had been having chronic abdominal pain over the past month. Workup had been negative. CT scan of the abdomen revealed acute appendicitis with some mucocele formation. He underwent laparoscopic appendectomy on 11/11/2016. Tolerated the procedure well. His postoperative course was remarkable for moderate incisional pain which has since improved. The patient's diet was advanced without difficulty. The patient being discharged home on 11/13/2016 in good and improving condition.  Treatments: surgery: Laparoscopic appendectomy on 11/11/2016  Discharge Exam: Blood pressure 118/63, pulse 65, temperature 97.4 F (36.3 C), temperature source Oral, resp. rate 19, height 6\' 2"  (1.88 m), weight 115.2 kg (254 lb), SpO2 97 %. General appearance: alert, cooperative and no distress Resp: clear to auscultation bilaterally Cardio: regular rate and rhythm, S1, S2 normal, no murmur, click, rub or gallop GI: Soft, dressings dry and intact.  Disposition: Final discharge disposition not confirmed  Discharge Instructions    Increase activity slowly    Complete by:  As directed      Allergies as of 11/13/2016   No Known Allergies     Medication List    TAKE these medications   cetirizine 10 MG tablet Commonly known as:  ZYRTEC Take 1 tablet (10 mg total) by mouth daily.   dicyclomine 20 MG tablet Commonly known as:  BENTYL Take 1 tablet (20 mg total) by mouth 3 (three) times daily before meals. What changed:  when to take this  reasons to take this    fluticasone 50 MCG/ACT nasal spray Commonly known as:  FLONASE Place 2 sprays into both nostrils daily.   multivitamin tablet Take 1 tablet by mouth daily.   omeprazole 20 MG capsule Commonly known as:  PRILOSEC Take 1 capsule (20 mg total) by mouth daily. Take 1 capsule (20mg ) by mouth daily for one month.   oxyCODONE-acetaminophen 7.5-325 MG tablet Commonly known as:  PERCOCET Take 1-2 tablets by mouth every 4 (four) hours as needed.      Follow-up Information    Jamesetta So, MD. Schedule an appointment as soon as possible for a visit on 11/20/2016.   Specialty:  General Surgery Contact information: [id number] [place] [id number] [phone]           Signed: Aviva Signs A 11/13/2016, 8:46 AM

## 2016-11-13 NOTE — Telephone Encounter (Signed)
appreciate FYI  Laroy Apple, MD [facility] 11/13/2016, 7:44 AM

## 2016-11-13 NOTE — Progress Notes (Signed)
Dilaudid PCA discontinued for discharge.  7 cc (7mg ) of dilaudid wasted and witnessed by Vista Deck RN

## 2016-11-13 NOTE — Discharge Instructions (Signed)
Laparoscopic Appendectomy, Adult, Care After °Refer to this sheet in the next few weeks. These instructions provide you with information about caring for yourself after your procedure. Your health care provider may also give you more specific instructions. Your treatment has been planned according to current medical practices, but problems sometimes occur. Call your health care provider if you have any problems or questions after your procedure. °What can I expect after the procedure? °After the procedure, it is common to have: °· A decrease in your energy level. °· Mild pain in the area where the surgical cuts (incisions) were made. °· Constipation. This can be caused by pain medicine and a decrease in your activity. ° °Follow these instructions at home: °Medicines °· Take over-the-counter and prescription medicines only as told by your health care provider. °· Do not drive for 24 hours if you received a sedative. °· Do not drive or operate heavy machinery while taking prescription pain medicine. °· If you were prescribed an antibiotic medicine, take it as told by your health care provider. Do not stop taking the antibiotic even if you start to feel better. °Activity °· For 3 weeks or as long as told by your health care provider: °? Do not lift anything that is heavier than 10 pounds (4.5 kg). °? Do not play contact sports. °· Gradually return to your normal activities. Ask your health care provider what activities are safe for you. °Bathing °· Keep your incisions clean and dry. Clean them as often as told by your health care provider: °? Gently wash the incisions with soap and water. °? Rinse the incisions with water to remove all soap. °? Pat the incisions dry with a clean towel. Do not rub the incisions. °· You may take showers after 48 hours. °· Do not take baths, swim, or use hot tubs for 2 weeks or as told by your health care provider. °Incision care °· Follow instructions from your healthcare provider about  how to take care of your incisions. Make sure you: °? Wash your hands with soap and water before you change your bandage (dressing). If soap and water are not available, use hand sanitizer. °? Change your dressing as told by your health care provider. °? Leave stitches (sutures), skin glue, or adhesive strips in place. These skin closures may need to stay in place for 2 weeks or longer. If adhesive strip edges start to loosen and curl up, you may trim the loose edges. Do not remove adhesive strips completely unless your health care provider tells you to do that. °· Check your incision areas every day for signs of infection. Check for: °? More redness, swelling, or pain. °? More fluid or blood. °? Warmth. °? Pus or a bad smell. °Other Instructions °· If you were sent home with a drain, follow instructions from your health care provider about how to care for the drain and how to empty it. °· Take deep breaths. This helps to prevent your lungs from becoming inflamed. °· To relieve and prevent constipation: °? Drink plenty of fluids. °? Eat plenty of fruits and vegetables. °· Keep all follow-up visits as told by your health care provider. This is important. °Contact a health care provider if: °· You have more redness, swelling, or pain around an incision. °· You have more fluid or blood coming from an incision. °· Your incision feels warm to the touch. °· You have pus or a bad smell coming from an incision or dressing. °· Your incision   dressing. °· Your incision edges break open after your sutures have been removed. °· You have increasing pain in your shoulders. °· You feel dizzy or you faint. °· You develop shortness of breath. °· You keep feeling nauseous or vomiting. °· You have diarrhea or you cannot control your bowel functions. °· You lose your appetite. °· You develop swelling or pain in your legs. °Get help right away if: °· You have a fever. °· You develop a rash. °· You have difficulty breathing. °· You have sharp pains in your  chest. °This information is not intended to replace advice given to you by your health care provider. Make sure you discuss any questions you have with your health care provider. °Document Released: 10/06/2005 Document Revised: 03/07/2016 Document Reviewed: 03/26/2015 °Elsevier Interactive Patient Education © 2017 [employer] ° °

## 2016-11-13 NOTE — Progress Notes (Signed)
Pt IV and telemetry removed, tolerated well.  Reviewed discharge instructions with pt and wife at bedside.  Answered all questions at this time.   

## 2016-11-17 ENCOUNTER — Telehealth: Payer: Self-pay | Admitting: Family Medicine

## 2016-11-17 NOTE — Telephone Encounter (Signed)
Appreciate FYI.   Laroy Apple, MD [facility] 11/17/2016, 5:08 PM

## 2016-11-17 NOTE — Telephone Encounter (Signed)
Pt called -  He states that the surgeon called him and said that they found some tumor in his abdomen when they done the appendectomy.   This is FYI: Edward Rivera

## 2016-11-19 ENCOUNTER — Telehealth: Payer: Self-pay | Admitting: Family Medicine

## 2016-11-19 NOTE — Telephone Encounter (Signed)
Her patient's request I have called and reviewed his pathology report with him.  Patient has good follow-up with his surgeon tomorrow to get staples out. 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  All of his questions were answered to the best of my ability, largely I recommended discussing this with his surgeon and and with the specialist that he's planning to refer him to.    Laroy Apple, MD [facility] 11/19/2016, 5:36 PM

## 2016-12-20 ENCOUNTER — Other Ambulatory Visit: Payer: Self-pay | Admitting: Gastroenterology

## 2017-01-30 ENCOUNTER — Telehealth: Payer: Self-pay | Admitting: Family Medicine

## 2017-02-02 NOTE — Telephone Encounter (Signed)
Has appt on 02/06/17 for hosp f/u

## 2017-02-06 ENCOUNTER — Ambulatory Visit: Payer: Medicaid Other | Admitting: Family Medicine

## 2017-02-10 ENCOUNTER — Ambulatory Visit: Payer: Medicaid Other | Admitting: Family Medicine

## 2017-02-27 ENCOUNTER — Telehealth: Payer: Self-pay | Admitting: Family Medicine

## 2017-02-27 DIAGNOSIS — D373 Neoplasm of uncertain behavior of appendix: Secondary | ICD-10-CM

## 2017-02-27 NOTE — Telephone Encounter (Signed)
Spoke with pt and let him know that Dr. Wendi Snipes is out of office but someone was getting his messages. Let him know that we would let him know if referral is placed. Pt recently had a "chemo washout" and pt wants to make sure he is eating the best nutritional foods he can.

## 2017-02-27 NOTE — Telephone Encounter (Signed)
Forwarding to Dr. Wendi Snipes who will be back Monday. The referral coordinator is out today.

## 2017-03-02 DIAGNOSIS — D373 Neoplasm of uncertain behavior of appendix: Secondary | ICD-10-CM | POA: Insufficient documentation

## 2017-03-02 NOTE — Telephone Encounter (Signed)
Referral placed.   Laroy Apple, MD [facility] 03/02/2017, 7:40 AM

## 2017-03-02 NOTE — Telephone Encounter (Signed)
Please advise Left message for pt that we will contact with appt

## 2017-03-09 ENCOUNTER — Encounter: Payer: Self-pay | Admitting: Registered"

## 2017-03-09 ENCOUNTER — Encounter: Payer: Medicare Other | Attending: Family Medicine | Admitting: Registered"

## 2017-03-09 DIAGNOSIS — D373 Neoplasm of uncertain behavior of appendix: Secondary | ICD-10-CM | POA: Insufficient documentation

## 2017-03-09 DIAGNOSIS — Z713 Dietary counseling and surveillance: Secondary | ICD-10-CM

## 2017-03-09 NOTE — Progress Notes (Signed)
Medical Nutrition Therapy:  Appt start time: 0930 end time:  1015.  Assessment:  Primary concerns today: Pt states he wants to learn how to eat healthy to prevent having health issues. Pt states he has recently had 3 surgeries; first had polyps removed and problems with diarrhea cleared. Patient states prior to this first surgery 16 yrs ago he was diagnosed (blood test) with celiac and although he ate gluten free had issues with diarrhea. Pt reports after the surgery the diarrhea cleared and he could eat gluten again with no problem.  Patient states second surgery was to remove appendix. 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 Pt reports that because the tumor ruptured, his physician performed a 3rd surgery, chemo washout. After last surgery Pt states he started a high fiber diet to help with constipation and he reports issue is resolving.  Pt states after surgery and changing his lifestyle and diet, he lost ~40 lbs. Pt states his doctor anticipates he will continue to lose about another 15 lb of excess fluid due to surgery.   RD inquired about sleep apnea on history and patient states he sleeps well and gets at least 8 hrs per night.  Preferred Learning Style:   No preference indicated   Learning Readiness:   Ready  Change in progress  MEDICATIONS: reviewed   DIETARY INTAKE: Lactose intolerant. One month ago started high fiber to address constipation caused chemo washout  Usual eating pattern includes 3 small meals and 2-3 snacks per day.  Avoided foods include diary, chocolate, cheese, sugar, pintos & corn bread    24-hr recall:  B ( AM): small bowl of cereal, raisin bran, lactose-free milk, Snk ( AM): fruit L ( PM): grilled chicken, mashed potatoes, green beans Snk ( PM): carrots, oranges, apples OR granola bar D ( PM): grilled, chicken, broccoli OR ground [place] tacos OR Pizza Snk ( PM): none  Beverages: water, cranberry juice (around 6  oz)  Usual physical activity: 1-2 miles day walking  Estimated energy needs: 2100 calories 235 g carbohydrates 158 g protein 58 g fat  Progress Towards Goal(s):  In progress.   Nutritional Diagnosis:  NB-1.1 Food and nutrition-related knowledge deficit As related to pt recent cancer (pt states is now clear) and desire to avoid health issues.  As evidenced by pt states desire for edcuation on healthy eating.    Intervention:  Nutrition Education. Discussed importance of a variety of micro and macronutrients to support good health. Discusses dietary sources for fiber. Discussed role of exercise in good health.  Teaching Method Utilized:  Visual Auditory  Handouts given during visit include:  2018 Dirty dozen  2018 Clean 15  Barriers to learning/adherence to lifestyle change: none  Demonstrated degree of understanding via:  Teach Back   Monitoring/Evaluation:  Dietary intake, exercise, and body weight prn.

## 2017-03-09 NOTE — Patient Instructions (Addendum)
Continue to get your daily activity Consider trying lentils, walnuts, almonds, peanuts Consider ground flaxseed, chia seeds Can include avocados and lactose-free yogurt Consider using whole grain bread

## 2017-03-12 ENCOUNTER — Ambulatory Visit: Payer: Medicaid Other | Admitting: Family Medicine

## 2017-03-23 ENCOUNTER — Ambulatory Visit: Payer: Medicaid Other | Admitting: Family Medicine

## 2017-03-24 ENCOUNTER — Encounter: Payer: Self-pay | Admitting: Family Medicine

## 2017-05-02 ENCOUNTER — Other Ambulatory Visit: Payer: Self-pay | Admitting: Gastroenterology

## 2017-05-09 ENCOUNTER — Other Ambulatory Visit: Payer: Self-pay | Admitting: Gastroenterology

## 2017-05-11 ENCOUNTER — Other Ambulatory Visit: Payer: Self-pay | Admitting: Gastroenterology

## 2017-05-12 ENCOUNTER — Other Ambulatory Visit: Payer: Self-pay | Admitting: Family Medicine

## 2017-05-12 MED ORDER — OMEPRAZOLE 20 MG PO CPDR: DELAYED_RELEASE_CAPSULE | ORAL | 0 refills | Status: DC

## 2017-05-12 NOTE — Telephone Encounter (Signed)
done

## 2017-05-12 NOTE — Telephone Encounter (Signed)
What is the name of the medication? omeprazole  Have you contacted your pharmacy to request a refill? yes  Which pharmacy would you like this sent to? [facility] in [place].   Patient notified that their request is being sent to the clinical staff for review and that they should receive a call once it is complete. If they do not receive a call within 24 hours they can check with their pharmacy or our office.

## 2017-06-29 ENCOUNTER — Ambulatory Visit (INDEPENDENT_AMBULATORY_CARE_PROVIDER_SITE_OTHER): Payer: Medicare Other | Admitting: Family Medicine

## 2017-06-29 ENCOUNTER — Encounter: Payer: Self-pay | Admitting: Family Medicine

## 2017-06-29 VITALS — BP 133/75 | HR 94 | Temp 96.6°F | Ht 74.0 in | Wt 234.6 lb

## 2017-06-29 DIAGNOSIS — R109 Unspecified abdominal pain: Secondary | ICD-10-CM | POA: Diagnosis not present

## 2017-06-29 DIAGNOSIS — J988 Other specified respiratory disorders: Secondary | ICD-10-CM

## 2017-06-29 DIAGNOSIS — B9789 Other viral agents as the cause of diseases classified elsewhere: Secondary | ICD-10-CM

## 2017-06-29 MED ORDER — DICYCLOMINE HCL 20 MG PO TABS: 20.0000 mg | ORAL_TABLET | Freq: Three times a day (TID) | ORAL | 3 refills | Status: DC | PRN

## 2017-06-29 NOTE — Patient Instructions (Signed)
Great to see you!  Come back in 4 months unless you need [place] sooner.    Viral Respiratory Infection A respiratory infection is an illness that affects part of the respiratory system, such as the lungs, nose, or throat. Most respiratory infections are caused by either viruses or bacteria. A respiratory infection that is caused by a virus is called a viral respiratory infection. Common types of viral respiratory infections include:  A cold.  The flu (influenza).  A respiratory syncytial virus (RSV) infection.  How do I know if I have a viral respiratory infection? Most viral respiratory infections cause:  A stuffy or runny nose.  Yellow or green nasal discharge.  A cough.  Sneezing.  Fatigue.  Achy muscles.  A sore throat.  Sweating or chills.  A fever.  A headache.  How are viral respiratory infections treated? If influenza is diagnosed early, it may be treated with an antiviral medicine that shortens the length of time a person has symptoms. Symptoms of viral respiratory infections may be treated with over-the-counter and prescription medicines, such as:  Expectorants. These make it easier to cough up mucus.  Decongestant nasal sprays.  Health care providers do not prescribe antibiotic medicines for viral infections. This is because antibiotics are designed to kill bacteria. They have no effect on viruses. How do I know if I should stay home from work or school? To avoid exposing others to your respiratory infection, stay home if you have:  A fever.  A persistent cough.  A sore throat.  A runny nose.  Sneezing.  Muscles aches.  Headaches.  Fatigue.  Weakness.  Chills.  Sweating.  Nausea.  Follow these instructions at home:  Rest as much as possible.  Take over-the-counter and prescription medicines only as told by your health care provider.  Drink enough fluid to keep your urine clear or pale yellow. This helps prevent dehydration and  helps loosen up mucus.  Gargle with a salt-water mixture 3-4 times per day or as needed. To make a salt-water mixture, completely dissolve -1 tsp of salt in 1 cup of warm water.  Use nose drops made from salt water to ease congestion and soften raw skin around your nose.  Do not drink alcohol.  Do not use tobacco products, including cigarettes, chewing tobacco, and e-cigarettes. If you need help quitting, ask your health care provider. Contact a health care provider if:  Your symptoms last for 10 days or longer.  Your symptoms get worse over time.  You have a fever.  You have severe sinus pain in your face or forehead.  The glands in your jaw or neck become very swollen. Get help right away if:  You feel pain or pressure in your chest.  You have shortness of breath.  You faint or feel like you will faint.  You have severe and persistent vomiting.  You feel confused or disoriented. This information is not intended to replace advice given to you by your health care provider. Make sure you discuss any questions you have with your health care provider. Document Released: 07/16/2005 Document Revised: 03/13/2016 Document Reviewed: 03/14/2015 Elsevier Interactive Patient Education  2017 [employer].

## 2017-06-29 NOTE — Progress Notes (Signed)
   HPI  Patient presents today here for follow-up and viral illness.  Patient has had cough and cold symptoms for about 2 days. He describes, cough productive of yellow sputum, congestion, sneezing, and malaise. He denies shortness of breath or chest pain. He is tolerating food and fluids with usual.  Needs refill Bentyl, this is very helpful for abdominal spasms.  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 This was removed in treatment for acute appendicitis and then later found to be low-grade cancer. He's had follow-up at [facility] and has no evidence of disease remaining.   PMH: Smoking status noted ROS: Per HPI  Objective: BP 133/75   Pulse 94   Temp (!) 96.6 F (35.9 C) (Oral)   Ht 6\' 2"  (1.88 m)   Wt 234 lb 9.6 oz (106.4 kg)   BMI 30.12 kg/m  Gen: NAD, alert, cooperative with exam HEENT: NCAT, oropharynx moist and clear with slightly enlarged tonsils with no exudates, nares clear CV: RRR, good S1/S2, no murmur Resp: CTABL, no wheezes, non-labored Abd: Soft, slight tenderness to palpation throughout, no guarding, positive bowel sounds Ext: No edema, warm Neuro: Alert and oriented, No gross deficits  Assessment and plan:  # Viral respiratory illness Reassurance provided Supportive care   # Abdominal cramping Previously felt to be due to irritable bowel syndrome, was later found to have celiac disease, 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 Patient with continued cramping that responds well to Bentyl, refill.    Meds ordered this encounter  Medications  . dicyclomine (BENTYL) 20 MG tablet    Sig: Take 1 tablet (20 mg total) by mouth 3 (three) times daily as needed for spasms.    Dispense:  90 tablet    Refill:  [place], MD [facility] 06/29/2017, 9:12 AM

## 2017-06-30 ENCOUNTER — Other Ambulatory Visit: Payer: Self-pay | Admitting: Family Medicine

## 2017-07-01 ENCOUNTER — Telehealth: Payer: Self-pay | Admitting: *Deleted

## 2017-07-01 MED ORDER — FLUTICASONE PROPIONATE 50 MCG/ACT NA SUSP: 2.0000 | Freq: Every day | NASAL | 11 refills | Status: DC

## 2017-07-01 NOTE — Telephone Encounter (Signed)
Pt aware refills sent to pharmacy 

## 2017-09-02 ENCOUNTER — Other Ambulatory Visit: Payer: Self-pay | Admitting: Family Medicine

## 2017-10-27 ENCOUNTER — Ambulatory Visit (INDEPENDENT_AMBULATORY_CARE_PROVIDER_SITE_OTHER): Payer: Medicare Other

## 2017-10-27 DIAGNOSIS — Z23 Encounter for immunization: Secondary | ICD-10-CM | POA: Diagnosis not present

## 2018-01-02 ENCOUNTER — Other Ambulatory Visit: Payer: Self-pay | Admitting: Family Medicine

## 2018-01-14 ENCOUNTER — Ambulatory Visit (INDEPENDENT_AMBULATORY_CARE_PROVIDER_SITE_OTHER): Payer: Medicare Other | Admitting: Family

## 2018-01-14 ENCOUNTER — Encounter: Payer: Self-pay | Admitting: Family

## 2018-01-14 VITALS — BP 130/77 | HR 81 | Temp 96.9°F | Ht 74.0 in | Wt 257.4 lb

## 2018-01-14 DIAGNOSIS — E669 Obesity, unspecified: Secondary | ICD-10-CM | POA: Diagnosis not present

## 2018-01-14 DIAGNOSIS — J029 Acute pharyngitis, unspecified: Secondary | ICD-10-CM | POA: Diagnosis not present

## 2018-01-14 DIAGNOSIS — J020 Streptococcal pharyngitis: Secondary | ICD-10-CM

## 2018-01-14 LAB — RAPID STREP SCREEN (MED CTR MEBANE ONLY): Strep Gp A Ag, IA W/Reflex: POSITIVE — AB

## 2018-01-14 MED ORDER — AMOXICILLIN 500 MG PO CAPS: 500.0000 mg | ORAL_CAPSULE | Freq: Two times a day (BID) | ORAL | 0 refills | Status: DC

## 2018-01-14 NOTE — Progress Notes (Signed)
   Subjective:    Patient ID: Edward Rivera, male    DOB: May 07, 1986, 32 y.o.   MRN: [id number]  Sore Throat   This is a new problem. The current episode started 1 to 4 weeks ago. The problem has been gradually worsening. There has been no fever. The pain is at a severity of 2/10. The pain is mild. Associated symptoms include congestion, coughing, ear pain and trouble swallowing. Pertinent negatives include no ear discharge, headaches or hoarse voice. He has tried acetaminophen and NSAIDs for the symptoms. The treatment provided mild relief.      Review of Systems  HENT: Positive for congestion, ear pain and trouble swallowing. Negative for ear discharge and hoarse voice.   Respiratory: Positive for cough.   Neurological: Negative for headaches.  All other systems reviewed and are negative.      Objective:   Physical Exam  Constitutional: He is oriented to person, place, and time. He appears well-developed and well-nourished. No distress.  HENT:  Head: Normocephalic.  Right Ear: External ear normal. Tympanic membrane is erythematous.  Left Ear: External ear normal.  Nose: Mucosal edema and rhinorrhea present.  Mouth/Throat: Posterior oropharyngeal edema and posterior oropharyngeal erythema present.  Eyes: Pupils are equal, round, and reactive to light. Right eye exhibits no discharge. Left eye exhibits no discharge.  Neck: Normal range of motion. Neck supple. No thyromegaly present.  Cardiovascular: Normal rate, regular rhythm, normal heart sounds and intact distal pulses.  No murmur heard. Pulmonary/Chest: Effort normal and breath sounds normal. No respiratory distress. He has no wheezes.  Abdominal: Soft. Bowel sounds are normal. He exhibits no distension. There is no tenderness.  Musculoskeletal: Normal range of motion. He exhibits no edema or tenderness.  Neurological: He is alert and oriented to person, place, and time.  Skin: Skin is warm and dry. No rash noted. No erythema.    Psychiatric: He has a normal mood and affect. His behavior is normal. Judgment and thought content normal.  Vitals reviewed.     BP 130/77   Pulse 81   Temp (!) 96.9 F (36.1 C) (Oral)   Ht 6\' 2"  (1.88 m)   Wt 257 lb 6.4 oz (116.8 kg)   SpO2 95%   BMI 33.05 kg/m      Assessment & Plan:  1. Sore throat  - Rapid Strep Screen (Not at [facility])  2. Strep throat - Take meds as prescribed - Use a cool mist humidifier  -Use saline nose sprays frequently -Force fluids -For any cough or congestion  Use plain Mucinex- regular strength or max strength is fine -For fever or aces or pains- take tylenol or ibuprofen. -Throat lozenges if help -New toothbrush in 3 days - amoxicillin (AMOXIL) 500 MG capsule; Take 1 capsule (500 mg total) by mouth 2 (two) times daily.  Dispense: 14 capsule; Refill: 0  3. Obesity (BMI 30-39.9)    Evelina Dun, FNP

## 2018-01-14 NOTE — Patient Instructions (Signed)

## 2018-02-03 ENCOUNTER — Ambulatory Visit (INDEPENDENT_AMBULATORY_CARE_PROVIDER_SITE_OTHER): Payer: Medicare Other | Admitting: Family Medicine

## 2018-02-03 ENCOUNTER — Encounter: Payer: Self-pay | Admitting: Family Medicine

## 2018-02-03 VITALS — BP 134/77 | HR 92 | Temp 97.3°F | Ht 74.0 in | Wt 253.4 lb

## 2018-02-03 DIAGNOSIS — H9203 Otalgia, bilateral: Secondary | ICD-10-CM

## 2018-02-03 MED ORDER — CEFDINIR 300 MG PO CAPS: 300.0000 mg | ORAL_CAPSULE | Freq: Two times a day (BID) | ORAL | 0 refills | Status: DC

## 2018-02-03 NOTE — Progress Notes (Signed)
   HPI  Patient presents today here for cough congestion and ear pain.  Patient was seen last month for strep pharyngitis, he states that he had complete resolution of symptoms after that.  He reports bilateral ear pain that is waxing and waning for the last week, he also has off-and-on muffled hearing. He is got nasal congestion and productive cough for 4 days. No fever or chills.  He does have allergies, they are helped by Zyrtec which he is on.  PMH: Smoking status noted ROS: Per HPI  Objective: BP 134/77   Pulse 92   Temp (!) 97.3 F (36.3 C) (Oral)   Ht 6\' 2"  (1.88 m)   Wt 253 lb 6.4 oz (114.9 kg)   SpO2 95%   BMI 32.53 kg/m  Gen: NAD, alert, cooperative with exam HEENT: NCAT, left TM with limited view due to cerumen but normal-appearing, right TM with erythema but no loss of landmarks, he does have an effusion CV: RRR, good S1/S2, no murmur Resp: CTABL, no wheezes, non-labored Ext: No edema, warm Neuro: Alert and oriented, No gross deficits  Assessment and plan:  #Otalgia Possible developing right ear infection Treat with Omnicef Discussed usual course of illness Return to clinic with any concerns   Meds ordered this encounter  Medications  . cefdinir (OMNICEF) 300 MG capsule    Sig: Take 1 capsule (300 mg total) by mouth 2 (two) times daily. 1 po BID    Dispense:  20 capsule    Refill:  0    Laroy Apple, MD [facility] 02/03/2018, 4:08 PM

## 2018-02-04 ENCOUNTER — Other Ambulatory Visit: Payer: Self-pay | Admitting: Family Medicine

## 2018-02-04 MED ORDER — OMEPRAZOLE 20 MG PO CPDR: DELAYED_RELEASE_CAPSULE | ORAL | 0 refills | Status: DC

## 2018-02-04 NOTE — Telephone Encounter (Signed)
What is the name of the medication? omeprazole 20mg   Have you contacted your pharmacy to request a refill? yes  Which pharmacy would you like this sent to? [facility]   Patient notified that their request is being sent to the clinical staff for review and that they should receive a call once it is complete. If they do not receive a call within 24 hours they can check with their pharmacy or our office.

## 2018-02-04 NOTE — Telephone Encounter (Signed)
Pt aware refill sent to pharmacy 

## 2018-02-16 ENCOUNTER — Emergency Department (HOSPITAL_COMMUNITY)
Admission: EM | Admit: 2018-02-16 | Discharge: 2018-02-17 | Disposition: A | Discharge status: Home / Self Care | Payer: Medicare Other | Attending: Emergency Medicine | Admitting: Emergency Medicine

## 2018-02-16 ENCOUNTER — Emergency Department (HOSPITAL_COMMUNITY): Payer: Medicare Other

## 2018-02-16 ENCOUNTER — Encounter (HOSPITAL_COMMUNITY): Payer: Self-pay | Admitting: Emergency Medicine

## 2018-02-16 DIAGNOSIS — R079 Chest pain, unspecified: Secondary | ICD-10-CM

## 2018-02-16 DIAGNOSIS — R0789 Other chest pain: Secondary | ICD-10-CM | POA: Insufficient documentation

## 2018-02-16 DIAGNOSIS — Z79899 Other long term (current) drug therapy: Secondary | ICD-10-CM | POA: Diagnosis not present

## 2018-02-16 DIAGNOSIS — R05 Cough: Secondary | ICD-10-CM | POA: Insufficient documentation

## 2018-02-16 DIAGNOSIS — R0602 Shortness of breath: Secondary | ICD-10-CM | POA: Diagnosis not present

## 2018-02-16 LAB — BASIC METABOLIC PANEL
Anion gap: 10 (ref 5–15)
BUN: 12 mg/dL (ref 6–20)
CO2: 22 mmol/L (ref 22–32)
Calcium: 9.5 mg/dL (ref 8.9–10.3)
Chloride: 107 mmol/L (ref 101–111)
Creatinine, Ser: 1.03 mg/dL (ref 0.61–1.24)
GFR calc Af Amer: >60 mL/min (ref >60)
GLUCOSE: 87 mg/dL (ref 65–99)
Potassium: 4 mmol/L (ref 3.5–5.1)
Sodium: 139 mmol/L (ref 135–145)

## 2018-02-16 LAB — CBC
HEMATOCRIT: 47.5 % (ref 39.0–52.0)
Hemoglobin: 17.3 g/dL — ABNORMAL HIGH (ref 13.0–17.0)
MCH: 33 pg (ref 26.0–34.0)
MCHC: 36.4 g/dL — AB (ref 30.0–36.0)
MCV: 90.5 fL (ref 78.0–100.0)
Platelets: 330 10*3/uL (ref 150–400)
RBC: 5.25 MIL/uL (ref 4.22–5.81)
RDW: 12.9 % (ref 11.5–15.5)
WBC: 10.4 10*3/uL (ref 4.0–10.5)

## 2018-02-16 LAB — I-STAT TROPONIN, ED
Troponin i, poc: 0 ng/mL (ref 0.00–0.08)
Troponin i, poc: 0 ng/mL (ref 0.00–0.08)

## 2018-02-16 MED ORDER — ONDANSETRON HCL 4 MG/2ML IJ SOLN
4.0000 mg | Freq: Once | INTRAMUSCULAR | Status: AC
  Administered 2018-02-17: 4 mg via INTRAVENOUS
  Filled 2018-02-16: qty 2

## 2018-02-16 MED ORDER — MORPHINE SULFATE (PF) 4 MG/ML IV SOLN
4.0000 mg | Freq: Once | INTRAVENOUS | Status: AC
  Administered 2018-02-17: 4 mg via INTRAVENOUS
  Filled 2018-02-16: qty 1

## 2018-02-16 NOTE — ED Provider Notes (Signed)
[facility] DEPT Provider Note   CSN: [id number] Arrival date & time: 02/16/18  1714     History   Chief Complaint Chief Complaint  Patient presents with  . Chest Pain    HPI DELANCE WEIDE is a 32 y.o. male.  Patient presents to the ED with a chief complaint of chest pain.  He states that he has had chest pain for the past several weeks.  He states that it gets worse with exertion.  He reports associated SOB.  He states that he has had some cough, but denies any fever.  He states that he was taking antibiotics for strep throat last week, which he has now finished.  He denies any history of ACS or PE.  Denies any recent travel, surgery, immobilization.  Denies any leg swelling or calf pain.  Denies any changes with position.  The history is provided by the patient. No language interpreter was used.    Past Medical History:  Diagnosis Date  . Allergy   . Celiac disease   . GERD (gastroesophageal reflux disease)   . Seizures ([place])    when patient was 32 years old    Patient Active Problem List   Diagnosis Date Noted  . Obesity (BMI 30-39.9) 01/14/2018  . 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 Celiac disease 03/04/2016  . Chronic diarrhea 03/04/2016    Past Surgical History:  Procedure Laterality Date  . COLONOSCOPY  06/2016  . HAND SURGERY Right   . LAPAROSCOPIC APPENDECTOMY N/A 11/11/2016   Procedure: APPENDECTOMY LAPAROSCOPIC;  Surgeon: Aviva Signs, MD;  Location: [facility];  Service: General;  Laterality: N/A;  . UPPER GASTROINTESTINAL ENDOSCOPY          Home Medications    Prior to Admission medications   Medication Sig Start Date End Date Taking? Authorizing Provider  cetirizine (ZYRTEC) 10 MG tablet TAKE ONE TABLET BY MOUTH ONCE DAILY Patient taking differently: Take 10mg  by mouth daily 09/02/17  Yes Timmothy Euler, MD  dicyclomine (BENTYL) 20 MG tablet Take 1 tablet (20 mg total) by mouth 3 (three) times daily  as needed for spasms. Patient taking differently: Take 20 mg by mouth daily as needed for spasms.  06/29/17  Yes Timmothy Euler, MD  fluticasone (FLONASE) 50 MCG/ACT nasal spray Place 2 sprays into both nostrils daily. 07/01/17  Yes Timmothy Euler, MD  Multiple Vitamin (MULTIVITAMIN) tablet Take 1 tablet by mouth daily.   Yes [provider]  omeprazole (PRILOSEC) 20 MG capsule TAKE 1 CAPSULE BY MOUTH ONCE DAILY (MUST  BE  SEEN  FOR  REFILLS) Patient taking differently: Take 20 mg by mouth daily. TAKE 1 CAPSULE BY MOUTH ONCE DAILY (MUST  BE  SEEN  FOR  REFILLS) 02/04/18  Yes Timmothy Euler, MD  Vitamin D, Ergocalciferol, 2000 units CAPS Take 2,000 Units by mouth daily.   Yes [provider]  cefdinir (OMNICEF) 300 MG capsule Take 1 capsule (300 mg total) by mouth 2 (two) times daily. 1 po BID Patient not taking: Reported on 02/16/2018 02/03/18   Timmothy Euler, MD    Family History Family History  Problem Relation Age of Onset  . Hypertension Father   . Diabetes Maternal Grandmother   . Colon cancer Maternal Grandmother   . Diabetes Maternal Grandfather   . Diabetes Paternal Grandmother   . Breast cancer Paternal Grandmother   . Diabetes Paternal Grandfather   . Hypertension Sister   .  Heart disease Paternal Uncle   . Esophageal cancer Neg Hx   . Pancreatic cancer Neg Hx   . Prostate cancer Neg Hx   . Rectal cancer Neg Hx   . Stomach cancer Neg Hx     Social History Social History   Tobacco Use  . Smoking status: Never Smoker  . Smokeless tobacco: Never Used  Substance Use Topics  . Alcohol use: Yes    Alcohol/week: 4.2 oz    Types: 7 Shots of liquor per week    Comment: 1 cocktail at night  . Drug use: No     Allergies   Patient has no known allergies.   Review of Systems Review of Systems  All other systems reviewed and are negative.    Physical Exam Updated Vital Signs BP 120/70 (BP Location: Left Arm)   Pulse 100   Temp 97.7  F (36.5 C) (Oral)   Resp 16   SpO2 99%   Physical Exam  Constitutional: He is oriented to person, place, and time. He appears well-developed and well-nourished.  HENT:  Head: Normocephalic and atraumatic.  Eyes: Pupils are equal, round, and reactive to light. Conjunctivae and EOM are normal. Right eye exhibits no discharge. Left eye exhibits no discharge. No scleral icterus.  Neck: Normal range of motion. Neck supple. No JVD present.  Cardiovascular: Normal rate, regular rhythm and normal heart sounds. Exam reveals no gallop and no friction rub.  No murmur heard. Pulmonary/Chest: Effort normal and breath sounds normal. No respiratory distress. He has no wheezes. He has no rales. He exhibits no tenderness.  Abdominal: Soft. He exhibits no distension and no mass. There is no tenderness. There is no rebound and no guarding.  Musculoskeletal: Normal range of motion. He exhibits no edema or tenderness.  Neurological: He is alert and oriented to person, place, and time.  Skin: Skin is warm and dry.  Psychiatric: He has a normal mood and affect. His behavior is normal. Judgment and thought content normal.  Nursing note and vitals reviewed.    ED Treatments / Results  Labs (all labs ordered are listed, but only abnormal results are displayed) Labs Reviewed  CBC - Abnormal; Notable for the following components:      Result Value   Hemoglobin 17.3 (*)    MCHC 36.4 (*)    All other components within normal limits  BASIC METABOLIC PANEL  D-DIMER, QUANTITATIVE (NOT AT [facility])  I-STAT TROPONIN, ED    EKG None  Radiology Dg Chest 2 View  Result Date: 02/16/2018 CLINICAL DATA:  Chest pain today. EXAM: CHEST - 2 VIEW COMPARISON:  Chest radiograph November 11, 2016 FINDINGS: Cardiomediastinal silhouette is unremarkable for this low inspiratory examination with crowded vasculature markings. The lungs are clear without pleural effusions or focal consolidations. Trachea projects midline and there  is no pneumothorax. Included soft tissue planes and osseous structures are non-suspicious. IMPRESSION: Negative. Electronically Signed   By: Elon Alas M.D.   On: 02/16/2018 18:16    Procedures Procedures (including critical care time)  Medications Ordered in ED Medications  morphine 4 MG/ML injection 4 mg (has no administration in time range)  ondansetron (ZOFRAN) injection 4 mg (has no administration in time range)     Initial Impression / Assessment and Plan / ED Course  I have reviewed the triage vital signs and the nursing notes.  Pertinent labs & imaging results that were available during my care of the patient were reviewed by me and considered in my  medical decision making (see chart for details).     Patient presents with chest pain x several weeks and gradually worsening with SOB.  Worse with exertion.  DDx includes ACS, PE, pneumothorax, aortic dissection, esophageal rupture, pericarditis, chest wall pain.  Doubt ACS, normal troponin, no ischemic EKG findings, HEART score is: 0.  Doubt PE, no leg swelling, no recent travel or immobilization.  D-dimer is negative.  No evidence of pneumothorax on CXR.  Doubt dissection, no mediastinal widening on CXR, no ripping/tearing chest pain, neurovascularly intact.  Doubt pericarditis, no positional changes, or diffuse ST elevations on EKG.    Delta troponin is negative.  Patient is currently pain free.  Recommend close follow-up with PCP.  Patient discussed with briefly with Dr. Sherry Ruffing.  All findings were discussed with patient.  Patient understands and agrees with the plan.     Final Clinical Impressions(s) / ED Diagnoses   Final diagnoses:  Nonspecific chest pain    ED Discharge Orders        Ordered    ibuprofen (ADVIL,MOTRIN) 800 MG tablet  3 times daily     02/17/18 0017       Montine Circle, PA-C 02/17/18 0020    Tegeler, Gwenyth Allegra, MD 02/17/18 1104

## 2018-02-16 NOTE — ED Triage Notes (Signed)
Pt repots that he came in from mowing yard about 3pm and started having left sided chest pains.

## 2018-02-17 ENCOUNTER — Ambulatory Visit (INDEPENDENT_AMBULATORY_CARE_PROVIDER_SITE_OTHER): Payer: Medicare Other | Admitting: Family Medicine

## 2018-02-17 ENCOUNTER — Encounter: Payer: Self-pay | Admitting: Family Medicine

## 2018-02-17 VITALS — BP 120/76 | HR 66 | Temp 97.3°F | Ht 74.0 in | Wt 253.0 lb

## 2018-02-17 DIAGNOSIS — Z8249 Family history of ischemic heart disease and other diseases of the circulatory system: Secondary | ICD-10-CM

## 2018-02-17 DIAGNOSIS — R0789 Other chest pain: Secondary | ICD-10-CM

## 2018-02-17 LAB — D-DIMER, QUANTITATIVE: D-Dimer, Quant: <0.27 ug/mL-FEU (ref 0.00–0.50)

## 2018-02-17 MED ORDER — IBUPROFEN 800 MG PO TABS: 800.0000 mg | ORAL_TABLET | Freq: Three times a day (TID) | ORAL | 0 refills | Status: DC

## 2018-02-17 NOTE — Progress Notes (Signed)
BP 120/76   Pulse 66   Temp (!) 97.3 F (36.3 C) (Oral)   Ht 6\' 2"  (1.88 m)   Wt 253 lb (114.8 kg)   SpO2 97%   BMI 32.48 kg/m    Subjective:    Patient ID: Edward Rivera, male    DOB: Jan 08, 1986, 32 y.o.   MRN: [id number]  HPI: Edward Rivera is a 32 y.o. male presenting on 02/17/2018 for ER Follow up (ER yesterday; having episodes of chest pain, sob, tachycardia, chills; history of heart disease - dad passed at 65 with MI, uncle passed at 11 with "rare heart disease")   HPI ER follow-up Patient is coming in today for an ER follow-up for chest pain.  He says his been having these chest pain on the left side of his chest he describes as a bad ache that has been going on over the past couple weeks.  He says is been happening every day and usually last 30 to 40 minutes when it happens and sometimes happens twice a day.  He says more frequently it arises when he is up walking or moving around or doing something.  Before the last 2 weeks he has never had this before.  He does have an extensive family history of heart disease under the age of 105 including his father who died of an MI at 44 and an uncle and cousin who had heart problems since aged 70.  He denies any chest pain currently right here in our office today but he says that is what to come into the emergency department last night.  He had an EKG which showed sinus tachycardia and lab work and a chest x-ray which came back all normal last night.  He also says that he gets short of breath and gets chills and palpitations when he is feeling the chest pain but all that goes away when the chest pain is gone.  Relevant past medical, surgical, family and social history reviewed and updated as indicated. Interim medical history since our last visit reviewed. Allergies and medications reviewed and updated.  Review of Systems  Constitutional: Positive for chills and fatigue. Negative for fever.  Eyes: Negative for discharge.  Respiratory: Positive  for chest tightness and shortness of breath. Negative for wheezing.   Cardiovascular: Positive for chest pain and palpitations. Negative for leg swelling.  Musculoskeletal: Negative for back pain and gait problem.  Skin: Negative for rash.  All other systems reviewed and are negative.   Per HPI unless specifically indicated above   Allergies as of 02/17/2018   No Known Allergies     Medication List        Accurate as of 02/17/18  3:35 PM. Always use your most recent med list.          cetirizine 10 MG tablet Commonly known as:  ZYRTEC TAKE ONE TABLET BY MOUTH ONCE DAILY   dicyclomine 20 MG tablet Commonly known as:  BENTYL Take 1 tablet (20 mg total) by mouth 3 (three) times daily as needed for spasms.   fluticasone 50 MCG/ACT nasal spray Commonly known as:  FLONASE Place 2 sprays into both nostrils daily.   ibuprofen 800 MG tablet Commonly known as:  ADVIL,MOTRIN Take 1 tablet (800 mg total) by mouth 3 (three) times daily.   multivitamin tablet Take 1 tablet by mouth daily.   omeprazole 20 MG capsule Commonly known as:  PRILOSEC TAKE 1 CAPSULE BY MOUTH ONCE DAILY (MUST  BE  SEEN  FOR  REFILLS)          Objective:    BP 120/76   Pulse 66   Temp (!) 97.3 F (36.3 C) (Oral)   Ht 6\' 2"  (1.88 m)   Wt 253 lb (114.8 kg)   SpO2 97%   BMI 32.48 kg/m   Wt Readings from Last 3 Encounters:  02/17/18 253 lb (114.8 kg)  02/03/18 253 lb 6.4 oz (114.9 kg)  01/14/18 257 lb 6.4 oz (116.8 kg)    Physical Exam  Constitutional: He is oriented to person, place, and time. He appears well-developed and well-nourished. No distress.  Eyes: Pupils are equal, round, and reactive to light. Conjunctivae and EOM are normal. Right eye exhibits no discharge. No scleral icterus.  Neck: Neck supple. No thyromegaly present.  Cardiovascular: Normal rate, regular rhythm, normal heart sounds and intact distal pulses.  No murmur heard. Pulmonary/Chest: Effort normal and breath sounds  normal. No respiratory distress. He has no wheezes.  Musculoskeletal: Normal range of motion. He exhibits no edema.  Lymphadenopathy:    He has no cervical adenopathy.  Neurological: He is alert and oriented to person, place, and time. Coordination normal.  Skin: Skin is warm and dry. No rash noted. He is not diaphoretic.  Psychiatric: He has a normal mood and affect. His behavior is normal.  Nursing note and vitals reviewed.   Results for orders placed or performed during the hospital encounter of 16/60/63  Basic metabolic panel  Result Value Ref Range   Sodium 139 135 - 145 mmol/L   Potassium 4.0 3.5 - 5.1 mmol/L   Chloride 107 101 - 111 mmol/L   CO2 22 22 - 32 mmol/L   Glucose, Bld 87 65 - 99 mg/dL   BUN 12 6 - 20 mg/dL   Creatinine, Ser 1.03 0.61 - 1.24 mg/dL   Calcium 9.5 8.9 - 10.3 mg/dL   GFR calc non Af Amer >60 >60 mL/min   GFR calc Af Amer >60 >60 mL/min   Anion gap 10 5 - 15  CBC  Result Value Ref Range   WBC 10.4 4.0 - 10.5 K/uL   RBC 5.25 4.22 - 5.81 MIL/uL   Hemoglobin 17.3 (H) 13.0 - 17.0 g/dL   HCT 47.5 39.0 - 52.0 %   MCV 90.5 78.0 - 100.0 fL   MCH 33.0 26.0 - 34.0 pg   MCHC 36.4 (H) 30.0 - 36.0 g/dL   RDW 12.9 11.5 - 15.5 %   Platelets 330 150 - 400 K/uL  D-dimer, quantitative (not at [facility])  Result Value Ref Range   D-Dimer, Quant <0.27 0.00 - 0.50 ug/mL-FEU  I-stat troponin, ED  Result Value Ref Range   Troponin i, poc 0.00 0.00 - 0.08 ng/mL   Comment 3          I-Stat Troponin, ED (not at [facility])  Result Value Ref Range   Troponin i, poc 0.00 0.00 - 0.08 ng/mL   Comment 3              Assessment & Plan:   Problem List Items Addressed This Visit    None    Visit Diagnoses    Atypical chest pain    -  Primary   Relevant Orders   Ambulatory referral to Cardiology   Family history of heart disease in male family member before age 60       Uncle and cousin both had premature heart disease and father died of an MI  at 32   Relevant Orders    Ambulatory referral to Cardiology       Follow up plan: Return if symptoms worsen or fail to improve.  Counseling provided for all of the vaccine components Orders Placed This Encounter  Procedures  . Ambulatory referral to Cardiology    Caryl Pina, MD [facility] 02/17/2018, 3:35 PM

## 2018-02-17 NOTE — Discharge Instructions (Signed)
Your ER workup shows no evidence of heart attack, pulmonary embolus (blood clot), or infection.  We would like for you to follow-up with your primary care doctor.  Please limit exertional activities until you are cleared by your doctor.  Please take medications as directed.  Return for new or worsening symptoms.

## 2018-02-18 ENCOUNTER — Ambulatory Visit (INDEPENDENT_AMBULATORY_CARE_PROVIDER_SITE_OTHER): Payer: Medicare Other | Admitting: Cardiovascular Disease

## 2018-02-18 ENCOUNTER — Encounter: Payer: Self-pay | Admitting: Cardiovascular Disease

## 2018-02-18 VITALS — BP 139/83 | HR 74 | Ht 74.0 in | Wt 252.4 lb

## 2018-02-18 DIAGNOSIS — R072 Precordial pain: Secondary | ICD-10-CM

## 2018-02-18 DIAGNOSIS — I208 Other forms of angina pectoris: Secondary | ICD-10-CM

## 2018-02-18 DIAGNOSIS — R079 Chest pain, unspecified: Secondary | ICD-10-CM | POA: Insufficient documentation

## 2018-02-18 MED ORDER — METOPROLOL TARTRATE 50 MG PO TABS: ORAL_TABLET | ORAL | 0 refills | Status: DC

## 2018-02-18 NOTE — Progress Notes (Signed)
02/18/2018 Edward Rivera   November 14, 1985  [id number]  Primary Physician Timmothy Euler, MD Primary Cardiologist: Lorretta Harp MD Lupe Carney, [place]  HPI:  Edward Rivera is a 32 y.o. moderately overweight married Caucasian male fathered 2 children who is on disability. He was referred by the emergency room because of new-onset chest pain. He has no risk factors other than family history the father who died of a myocardial infarction at age 69. He developed new-onset chest pain several weeks ago is predictably exertional and this somewhat radiated to the left shoulder. He is seen in the emergency room on 01/20/18 for chest pain rule out MI. Enzymes were negative. His EKG showed no acute changes although there were some subtle diffuse ST segment elevations consistent with pericarditis.    Current Meds  Medication Sig  . cetirizine (ZYRTEC) 10 MG tablet TAKE ONE TABLET BY MOUTH ONCE DAILY (Patient taking differently: Take 10mg  by mouth daily)  . dicyclomine (BENTYL) 20 MG tablet Take 1 tablet (20 mg total) by mouth 3 (three) times daily as needed for spasms. (Patient taking differently: Take 20 mg by mouth daily as needed for spasms. )  . fluticasone (FLONASE) 50 MCG/ACT nasal spray Place 2 sprays into both nostrils daily. (Patient taking differently: Place 2 sprays into both nostrils daily as needed. )  . ibuprofen (ADVIL,MOTRIN) 800 MG tablet Take 1 tablet (800 mg total) by mouth 3 (three) times daily. (Patient taking differently: Take 800 mg by mouth every 8 (eight) hours as needed. )  . Multiple Vitamin (MULTIVITAMIN) tablet Take 1 tablet by mouth daily.  Marland Kitchen omeprazole (PRILOSEC) 20 MG capsule TAKE 1 CAPSULE BY MOUTH ONCE DAILY (MUST  BE  SEEN  FOR  REFILLS) (Patient taking differently: Take 20 mg by mouth daily. TAKE 1 CAPSULE BY MOUTH ONCE DAILY (MUST  BE  SEEN  FOR  REFILLS))     No Known Allergies  Social History   Socioeconomic History  . Marital status: Married    Spouse  name: Not on file  . Number of children: 2  . Years of education: Not on file  . Highest education level: Not on file  Occupational History  . Occupation: disabled  Social Needs  . Financial resource strain: Not on file  . Food insecurity:    Worry: Not on file    Inability: Not on file  . Transportation needs:    Medical: Not on file    Non-medical: Not on file  Tobacco Use  . Smoking status: Never Smoker  . Smokeless tobacco: Never Used  Substance and Sexual Activity  . Alcohol use: Yes    Alcohol/week: 4.2 oz    Types: 7 Shots of liquor per week    Comment: 1 cocktail at night  . Drug use: No  . Sexual activity: Not on file  Lifestyle  . Physical activity:    Days per week: Not on file    Minutes per session: Not on file  . Stress: Not on file  Relationships  . Social connections:    Talks on phone: Not on file    Gets together: Not on file    Attends religious service: Not on file    Active member of club or organization: Not on file    Attends meetings of clubs or organizations: Not on file    Relationship status: Not on file  . Intimate partner violence:    Fear of current or ex partner:  Not on file    Emotionally abused: Not on file    Physically abused: Not on file    Forced sexual activity: Not on file  Other Topics Concern  . Not on file  Social History Narrative  . Not on file     Review of Systems: General: negative for chills, fever, night sweats or weight changes.  Cardiovascular: negative for chest pain, dyspnea on exertion, edema, orthopnea, palpitations, paroxysmal nocturnal dyspnea or shortness of breath Dermatological: negative for rash Respiratory: negative for cough or wheezing Urologic: negative for hematuria Abdominal: negative for nausea, vomiting, diarrhea, bright red blood per rectum, melena, or hematemesis Neurologic: negative for visual changes, syncope, or dizziness All other systems reviewed and are otherwise negative except as  noted above.    Blood pressure 139/83, pulse 74, height 6\' 2"  (1.88 m), weight 252 lb 6.4 oz (114.5 kg), SpO2 98 %.  General appearance: alert and no distress Neck: no adenopathy, no carotid bruit, no JVD, supple, symmetrical, trachea midline and thyroid not enlarged, symmetric, no tenderness/mass/nodules Lungs: clear to auscultation bilaterally Heart: regular rate and rhythm, S1, S2 normal, no murmur, click, rub or gallop Extremities: extremities normal, atraumatic, no cyanosis or edema Pulses: 2+ and symmetric Skin: Skin color, texture, turgor normal. No rashes or lesions Neurologic: Alert and oriented X 3, normal strength and tone. Normal symmetric reflexes. Normal coordination and gait  EKG not performed today  ASSESSMENT AND PLAN:   Chest pain Mr. Girton is a 32 year old mildly overweight Caucasian male recently seen in the ER on 02/16/18 which has pain. He has no cardiac risk factors other than family history. He had chest pain for several weeks that is predictably related to exertion with some left shoulder radiation. In the ER his EKG showed no acute changes. Enzymes were negative. He does have a history of GERD. There was some suggestion of diffuse ST segment elevation potentially indicative of pericarditis. I do not hear a rub. Abdomen negative 2-D echo on him and a coronary CTA especially in light of his early family history of heart disease and the predictable nature of the pain with exertion.      Lorretta Harp MD FACP,FACC,FAHA, [facility] 02/18/2018 1:41 PM

## 2018-02-18 NOTE — Patient Instructions (Signed)
Medication Instructions: Your physician recommends that you continue on your current medications as directed. Please refer to the Current Medication list given to you today.   Labwork: Your physician recommends that you return for lab work prior to CTA--BMET.   Testing/Procedures:  Coronary CTA:   Please arrive at the [facility] main entrance of [facility] at                   AM (30-45 minutes prior to test start time)  [facility] [place], [place] [address] [phone]  Proceed to the [facility] Radiology Department (First Floor).  Please follow these instructions carefully (unless otherwise directed):  Hold all erectile dysfunction medications at least 48 hours prior to test.  On the Night Before the Test: . Drink plenty of water. . Do not consume any caffeinated/decaffeinated beverages or chocolate 12 hours prior to your test. . Do not take any antihistamines 12 hours prior to your test.  On the Day of the Test: . Drink plenty of water. Do not drink any water within one hour of the test. . Do not eat any food 4 hours prior to the test. . You may take your regular medications prior to the test. . IF NOT ON A BETA BLOCKER - Take 50 mg of lopressor (metoprolol) one hour before the test.  After the Test: . Drink plenty of water. . After receiving IV contrast, you may experience a mild flushed feeling. This is normal. . On occasion, you may experience a mild rash up to 24 hours after the test. This is not dangerous. If this occurs, you can take Benadryl 25 mg and increase your fluid intake. . If you experience trouble breathing, this can be serious. If it is severe call 911 IMMEDIATELY. If it is mild, please call our office. . If you take any of these medications: Glipizide/Metformin, Avandament, Glucavance, please do not take 48 hours after completing  test.  ---------------------------------------------------------------------------------------------  Your physician has requested that you have an echocardiogram. Echocardiography is a painless test that uses sound waves to create images of your heart. It provides your doctor with information about the size and shape of your heart and how well your heart's chambers and valves are working. This procedure takes approximately one hour. There are no restrictions for this procedure.    Follow-Up: Your physician recommends that you schedule a follow-up appointment in: after testing with Dr. Gwenlyn Found.   If you need a refill on your cardiac medications before your next appointment, please call your pharmacy.

## 2018-02-18 NOTE — Assessment & Plan Note (Signed)
Edward Rivera is a 32 year old mildly overweight Caucasian male recently seen in the ER on 02/16/18 which has pain. He has no cardiac risk factors other than family history. He had chest pain for several weeks that is predictably related to exertion with some left shoulder radiation. In the ER his EKG showed no acute changes. Enzymes were negative. He does have a history of GERD. There was some suggestion of diffuse ST segment elevation potentially indicative of pericarditis. I do not hear a rub. Abdomen negative 2-D echo on him and a coronary CTA especially in light of his early family history of heart disease and the predictable nature of the pain with exertion.

## 2018-02-23 LAB — BASIC METABOLIC PANEL WITH GFR
BUN/Creatinine Ratio: 12 (ref 9–20)
BUN: 10 mg/dL (ref 6–20)
CO2: 23 mmol/L (ref 20–29)
Calcium: 9.2 mg/dL (ref 8.7–10.2)
Chloride: 103 mmol/L (ref 96–106)
Creatinine, Ser: 0.85 mg/dL (ref 0.76–1.27)
GFR calc Af Amer: 132 mL/min/1.73
GFR calc non Af Amer: 114 mL/min/1.73
Glucose: 84 mg/dL (ref 65–99)
Potassium: 4.4 mmol/L (ref 3.5–5.2)
Sodium: 141 mmol/L (ref 134–144)

## 2018-02-25 ENCOUNTER — Other Ambulatory Visit (HOSPITAL_COMMUNITY): Payer: Medicare Other

## 2018-02-25 ENCOUNTER — Other Ambulatory Visit: Payer: Self-pay

## 2018-02-25 ENCOUNTER — Encounter (HOSPITAL_COMMUNITY): Payer: Self-pay | Admitting: *Deleted

## 2018-02-25 ENCOUNTER — Ambulatory Visit (HOSPITAL_COMMUNITY): Payer: Medicare Other | Attending: Cardiovascular Disease

## 2018-02-25 DIAGNOSIS — Z8249 Family history of ischemic heart disease and other diseases of the circulatory system: Secondary | ICD-10-CM | POA: Insufficient documentation

## 2018-02-25 DIAGNOSIS — R002 Palpitations: Secondary | ICD-10-CM | POA: Diagnosis not present

## 2018-02-25 DIAGNOSIS — R072 Precordial pain: Secondary | ICD-10-CM

## 2018-02-25 NOTE — Progress Notes (Unsigned)
Mr. Edward Rivera arrived at the hospital lab this morning (02-25-18) thinking he was having his Coronary CTA and had prepared accordingly per Dr. Kennon Holter instructions,  (had taken Metropolol 1 hour prior).  The patient was able to be rescheduled for his echo same day and moved to 4 pm, however he will need another prescription for his metropolol dose for when the CTA gets scheduled.  Please advise.    Deliah Boston, RDCS

## 2018-02-26 ENCOUNTER — Ambulatory Visit (INDEPENDENT_AMBULATORY_CARE_PROVIDER_SITE_OTHER): Payer: Medicare Other | Admitting: Family Medicine

## 2018-02-26 ENCOUNTER — Encounter: Payer: Self-pay | Admitting: Family Medicine

## 2018-02-26 VITALS — BP 113/72 | HR 95 | Temp 97.0°F | Ht 74.0 in | Wt 250.2 lb

## 2018-02-26 DIAGNOSIS — I208 Other forms of angina pectoris: Secondary | ICD-10-CM | POA: Diagnosis not present

## 2018-02-26 DIAGNOSIS — R195 Other fecal abnormalities: Secondary | ICD-10-CM

## 2018-02-26 MED ORDER — DICYCLOMINE HCL 20 MG PO TABS: 20.0000 mg | ORAL_TABLET | Freq: Three times a day (TID) | ORAL | 3 refills | Status: DC | PRN

## 2018-02-26 MED ORDER — METOPROLOL TARTRATE 50 MG PO TABS: ORAL_TABLET | ORAL | 0 refills | Status: DC

## 2018-02-26 MED ORDER — CETIRIZINE HCL 10 MG PO TABS: ORAL_TABLET | ORAL | 1 refills | Status: DC

## 2018-02-26 NOTE — Progress Notes (Signed)
   HPI  Patient presents today here with loose stools.  Patient explains that he has had diarrhea for 5 days, upon discussion it turns out he is having 2 large loose stools daily. This is been going on for 5 days. No change in diet. No fever, sweats, nausea, vomiting.  Patient does have history of chronic loose stools.  He is also had a recent struggle with chest pain.  This is exertional, he is being worked up with cardiology and his echo is normal. He does endorse some symptoms of anxiety and intermittent depression. He is open to starting medications if needed  PMH: Smoking status noted ROS: Per HPI  Objective: BP 113/72   Pulse 95   Temp (!) 97 F (36.1 C) (Oral)   Ht 6\' 2"  (1.88 m)   Wt 250 lb 3.2 oz (113.5 kg)   BMI 32.12 kg/m  Gen: NAD, alert, cooperative with exam HEENT: NCAT, EOMI, PERRL CV: RRR, good S1/S2, no murmur Resp: CTABL, no wheezes, non-labored Abd: Positive bowel sounds, soft, reported mild tenderness with no grimace throughout Ext: No edema, warm Neuro: Alert and oriented, No gross deficits  Assessment and plan:  #Loose stools Reassurance provided, likely due to the anxiety syndrome that he is currently going through. I believe this is an exacerbation also have his long-term issue with loose stools. Refill Bentyl No red flags  Chest pain Worked up currently with cardiology, testing so far looks very encouraging. Consider anxiety as a possible etiology, recommended patient follow-up in 2 to 3 weeks after complete work-up to consider starting Lexapro.  Meds ordered this encounter  Medications  . cetirizine (ZYRTEC) 10 MG tablet    Sig: Take 10mg  by mouth daily    Dispense:  90 tablet    Refill:  1    Please consider 90 day supplies to promote better adherence  . dicyclomine (BENTYL) 20 MG tablet    Sig: Take 1 tablet (20 mg total) by mouth 3 (three) times daily as needed for spasms.    Dispense:  90 tablet    Refill:  3  . metoprolol  tartrate (LOPRESSOR) 50 MG tablet    Sig: Take 1 tablet by mouth one hour prior to CTA.    Dispense:  1 tablet    Refill:  0    Laroy Apple, MD [facility] 02/26/2018, 9:14 AM

## 2018-02-26 NOTE — Progress Notes (Signed)
This needs to be addressed

## 2018-02-26 NOTE — Patient Instructions (Signed)
Great to see you!   

## 2018-03-01 ENCOUNTER — Telehealth: Payer: Self-pay | Admitting: Family Medicine

## 2018-03-02 NOTE — Progress Notes (Unsigned)
CTA has not been scheduled yet

## 2018-03-02 NOTE — Telephone Encounter (Signed)
Pt informed to contact cardiologist for results Verbalizes understanding

## 2018-03-08 ENCOUNTER — Telehealth: Payer: Self-pay | Admitting: Internal Medicine

## 2018-03-08 NOTE — Telephone Encounter (Signed)
Did not need this encounter °

## 2018-03-19 ENCOUNTER — Ambulatory Visit (INDEPENDENT_AMBULATORY_CARE_PROVIDER_SITE_OTHER): Payer: Medicare Other | Admitting: Family Medicine

## 2018-03-19 ENCOUNTER — Encounter: Payer: Self-pay | Admitting: Family Medicine

## 2018-03-19 VITALS — BP 126/79 | HR 81 | Temp 97.2°F | Ht 74.0 in | Wt 254.0 lb

## 2018-03-19 DIAGNOSIS — R0789 Other chest pain: Secondary | ICD-10-CM

## 2018-03-19 DIAGNOSIS — I208 Other forms of angina pectoris: Secondary | ICD-10-CM | POA: Diagnosis not present

## 2018-03-19 NOTE — Progress Notes (Signed)
   HPI  Patient presents today for chest pain.  Patient states chest pain is still happening intermittently but improved overall.  Last visit my impression was that he likely had anxiety, however he describes her musculoskeletal chest pain today.  He states that he has dull achy left pectoral pain whenever he is doing exertion that involves the upper body such as weed eating, cutting down trees, or push mowing lawns.  Patient denies any worsening in the pain and states that it is improving slowly.  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  PMH: Smoking status noted ROS: Per HPI  Objective: BP 126/79   Pulse 81   Temp (!) 97.2 F (36.2 C) (Oral)   Ht 6\' 2"  (1.88 m)   Wt 254 lb (115.2 kg)   BMI 32.61 kg/m  Gen: NAD, alert, cooperative with exam HEENT: NCAT CV: RRR, good S1/S2, no murmur Chest wall-point tenderness to palpation of left pectoral muscle Resp: CTABL, no wheezes, non-labored Ext: No edema, warm Neuro: Alert and oriented, No gross deficits  Assessment and plan:  #Musculoskeletal chest pain Most likely diagnosis, patient still has coronary CT coming up which will complete his cardiac work-up. At this point my opinion is that he most likely has musculoskeletal chest pain which is improving. Return to clinic in 2 to 3 months for recheck    Laroy Apple, MD [place] 03/19/2018, 8:19 AM

## 2018-04-12 NOTE — Progress Notes (Unsigned)
Cardiology Office Note   Date:  04/12/2018   ID:  Edward Rivera, DOB [birth year], MRN [id number]  PCP:  Timmothy Euler, MD  Cardiologist:   No primary care provider on file. Referring:  ***  No chief complaint on file.     History of Present Illness: Edward Rivera is a 32 y.o. male who is referred by *** for evaluation of ***.  He was in the ED in April for this and I did review these notes for this visit.  He had no acute ST changes and negative enzymes.  He has no past cardiac history.   He did have an echo last month and this demonstrated no significant findings.  ***    Past Medical History:  Diagnosis Date  . Allergy   . Celiac disease   . GERD (gastroesophageal reflux disease)   . Seizures ([place])    when patient was 32 years old    Past Surgical History:  Procedure Laterality Date  . COLONOSCOPY  06/2016  . HAND SURGERY Right   . LAPAROSCOPIC APPENDECTOMY N/A 11/11/2016   Procedure: APPENDECTOMY LAPAROSCOPIC;  Surgeon: Aviva Signs, MD;  Location: [facility];  Service: General;  Laterality: N/A;  . UPPER GASTROINTESTINAL ENDOSCOPY       Current Outpatient Medications  Medication Sig Dispense Refill  . cetirizine (ZYRTEC) 10 MG tablet Take 10mg  by mouth daily 90 tablet 1  . dicyclomine (BENTYL) 20 MG tablet Take 1 tablet (20 mg total) by mouth 3 (three) times daily as needed for spasms. 90 tablet 3  . fluticasone (FLONASE) 50 MCG/ACT nasal spray Place 2 sprays into both nostrils daily. (Patient taking differently: Place 2 sprays into both nostrils daily as needed. ) 16 g 11  . ibuprofen (ADVIL,MOTRIN) 800 MG tablet Take 1 tablet (800 mg total) by mouth 3 (three) times daily. (Patient taking differently: Take 800 mg by mouth every 8 (eight) hours as needed. ) 21 tablet 0  . metoprolol tartrate (LOPRESSOR) 50 MG tablet Take 1 tablet by mouth one hour prior to CTA. 1 tablet 0  . Multiple Vitamin (MULTIVITAMIN) tablet Take 1 tablet by mouth daily.    Marland Kitchen omeprazole (PRILOSEC)  20 MG capsule TAKE 1 CAPSULE BY MOUTH ONCE DAILY (MUST  BE  SEEN  FOR  REFILLS) (Patient taking differently: Take 20 mg by mouth daily. TAKE 1 CAPSULE BY MOUTH ONCE DAILY (MUST  BE  SEEN  FOR  REFILLS)) 90 capsule 0   No current facility-administered medications for this visit.     Allergies:   Patient has no known allergies.    Social History:  The patient  reports that he has never smoked. He has never used smokeless tobacco. He reports that he drinks about 4.2 oz of alcohol per week. He reports that he does not use drugs.   Family History:  The patient's ***family history includes Breast cancer in his paternal grandmother; Cancer in his mother; Colon cancer in his maternal grandmother; Diabetes in his maternal grandfather, maternal grandmother, paternal grandfather, and paternal grandmother; Early death in his father and maternal uncle; Heart disease in his paternal uncle; Hypertension in his father and sister.    ROS:  Please see the history of present illness.   Otherwise, review of systems are positive for {NONE DEFAULTED:18576::"none"}.   All other systems are reviewed and negative.    PHYSICAL EXAM: VS:  There were no vitals taken for this visit. , BMI There is no height or weight  on file to calculate BMI. GENERAL:  Well appearing HEENT:  Pupils equal round and reactive, fundi not visualized, oral mucosa unremarkable NECK:  No jugular venous distention, waveform within normal limits, carotid upstroke brisk and symmetric, no bruits, no thyromegaly LYMPHATICS:  No cervical, inguinal adenopathy LUNGS:  Clear to auscultation bilaterally BACK:  No CVA tenderness CHEST:  Unremarkable HEART:  PMI not displaced or sustained,S1 and S2 within normal limits, no S3, no S4, no clicks, no rubs, *** murmurs ABD:  Flat, positive bowel sounds normal in frequency in pitch, no bruits, no rebound, no guarding, no midline pulsatile mass, no hepatomegaly, no splenomegaly EXT:  2 plus pulses throughout,  no edema, no cyanosis no clubbing SKIN:  No rashes no nodules NEURO:  Cranial nerves II through XII grossly intact, motor grossly intact throughout PSYCH:  Cognitively intact, oriented to person place and time    EKG:  EKG {ACTION; IS/IS CHY:85027741} ordered today. The ekg ordered today demonstrates ***   Recent Labs: 02/16/2018: Hemoglobin 17.3; Platelets 330 02/22/2018: BUN 10; Creatinine, Ser 0.85; Potassium 4.4; Sodium 141    Lipid Panel    Component Value Date/Time   CHOL 192 03/04/2016 1406   TRIG 238 (H) 03/04/2016 1406   HDL 35 (L) 03/04/2016 1406   CHOLHDL 5.5 (H) 03/04/2016 1406   LDLCALC 109 (H) 03/04/2016 1406      Wt Readings from Last 3 Encounters:  03/19/18 254 lb (115.2 kg)  02/26/18 250 lb 3.2 oz (113.5 kg)  02/18/18 252 lb 6.4 oz (114.5 kg)      Other studies Reviewed: Additional studies/ records that were reviewed today include: ***. Review of the above records demonstrates:  Please see elsewhere in the note.  ***   ASSESSMENT AND PLAN:  CHEST PAIN:  ***   Current medicines are reviewed at length with the patient today.  The patient {ACTIONS; HAS/DOES NOT HAVE:19233} concerns regarding medicines.  The following changes have been made:  {PLAN; NO CHANGE:13088:s}  Labs/ tests ordered today include: *** No orders of the defined types were placed in this encounter.    Disposition:   FU with ***    Signed, Minus Breeding, MD  04/12/2018 7:09 PM    [facility]

## 2018-04-13 ENCOUNTER — Encounter: Payer: Self-pay | Admitting: Cardiology

## 2018-04-13 ENCOUNTER — Ambulatory Visit: Payer: Medicare Other | Admitting: Cardiology

## 2018-04-26 ENCOUNTER — Ambulatory Visit (HOSPITAL_COMMUNITY)
Admission: RE | Admit: 2018-04-26 | Discharge: 2018-04-26 | Disposition: A | Discharge status: Home / Self Care | Payer: Medicare Other | Source: Ambulatory Visit | Attending: Cardiovascular Disease | Admitting: Cardiovascular Disease

## 2018-04-26 DIAGNOSIS — R079 Chest pain, unspecified: Secondary | ICD-10-CM | POA: Diagnosis not present

## 2018-04-26 DIAGNOSIS — R072 Precordial pain: Secondary | ICD-10-CM | POA: Insufficient documentation

## 2018-04-26 MED ORDER — NITROGLYCERIN 0.4 MG SL SUBL: 0.8000 mg | SUBLINGUAL_TABLET | Freq: Once | SUBLINGUAL | Status: DC

## 2018-04-26 MED ORDER — METOPROLOL TARTRATE 5 MG/5ML IV SOLN
5.0000 mg | INTRAVENOUS | Status: AC | PRN
  Administered 2018-04-26 (×4): 5 mg via INTRAVENOUS

## 2018-04-26 MED ORDER — METOPROLOL TARTRATE 5 MG/5ML IV SOLN
INTRAVENOUS | Status: AC
  Filled 2018-04-26: qty 15

## 2018-04-26 MED ORDER — IOPAMIDOL (ISOVUE-370) INJECTION 76%
INTRAVENOUS | Status: AC
  Filled 2018-04-26: qty 100

## 2018-04-26 MED ORDER — IOPAMIDOL (ISOVUE-370) INJECTION 76%
100.0000 mL | Freq: Once | INTRAVENOUS | Status: AC | PRN
  Administered 2018-04-26: 100 mL via INTRAVENOUS

## 2018-04-26 MED ORDER — METOPROLOL TARTRATE 5 MG/5ML IV SOLN
INTRAVENOUS | Status: AC
  Filled 2018-04-26: qty 5

## 2018-04-26 MED ORDER — NITROGLYCERIN 0.4 MG SL SUBL
SUBLINGUAL_TABLET | SUBLINGUAL | Status: AC
  Administered 2018-04-26: 0.8 mg
  Filled 2018-04-26: qty 2

## 2018-05-04 ENCOUNTER — Encounter: Payer: Self-pay | Admitting: Family Medicine

## 2018-05-04 ENCOUNTER — Ambulatory Visit (INDEPENDENT_AMBULATORY_CARE_PROVIDER_SITE_OTHER): Payer: Medicare Other | Admitting: Family Medicine

## 2018-05-04 VITALS — BP 135/83 | HR 104 | Temp 97.1°F | Ht 74.0 in | Wt 264.2 lb

## 2018-05-04 DIAGNOSIS — K2100 Gastro-esophageal reflux disease with esophagitis, without bleeding: Secondary | ICD-10-CM

## 2018-05-04 DIAGNOSIS — I208 Other forms of angina pectoris: Secondary | ICD-10-CM | POA: Diagnosis not present

## 2018-05-04 MED ORDER — OMEPRAZOLE 20 MG PO CPDR: 20.0000 mg | DELAYED_RELEASE_CAPSULE | Freq: Every day | ORAL | 3 refills | Status: DC

## 2018-05-04 NOTE — Progress Notes (Signed)
   HPI  Patient presents today for follow-up  She reports very good symptomatic relief with GERD using omeprazole.  He would like a refill.  Is been taking it twice daily but has taken it once daily on occasion with good resolution of symptoms.  PMH: Smoking status noted ROS: Per HPI  Objective: BP 135/83   Pulse (!) 104   Temp (!) 97.1 F (36.2 C) (Oral)   Ht 6\' 2"  (1.88 m)   Wt 264 lb 3.2 oz (119.8 kg)   BMI 33.92 kg/m  Gen: NAD, alert, cooperative with exam HEENT: NCAT CV: RRR, good S1/S2, no murmur Resp: CTABL, no wheezes, non-labored Ext: No edema, warm Neuro: Alert and oriented, No gross deficits  Assessment and plan:  #GERD, previously esophagitis Treat with PPI, continue, he is doing very well. De-escalate to once daily dosing    Meds ordered this encounter  Medications  . omeprazole (PRILOSEC) 20 MG capsule    Sig: Take 1 capsule (20 mg total) by mouth daily. TAKE 1 CAPSULE BY MOUTH ONCE DAILY (MUST  BE  SEEN  FOR  REFILLS)    Dispense:  90 capsule    Refill:  [place], MD [facility] 05/04/2018, 4:09 PM

## 2018-06-08 ENCOUNTER — Ambulatory Visit: Payer: Medicare Other | Admitting: Cardiovascular Disease

## 2018-07-21 ENCOUNTER — Encounter: Payer: Self-pay | Admitting: Family Medicine

## 2018-07-21 ENCOUNTER — Ambulatory Visit (INDEPENDENT_AMBULATORY_CARE_PROVIDER_SITE_OTHER): Payer: Medicare Other | Admitting: Family Medicine

## 2018-07-21 VITALS — BP 133/84 | HR 116 | Temp 97.9°F | Ht 74.0 in | Wt 259.0 lb

## 2018-07-21 DIAGNOSIS — J329 Chronic sinusitis, unspecified: Secondary | ICD-10-CM | POA: Diagnosis not present

## 2018-07-21 DIAGNOSIS — J40 Bronchitis, not specified as acute or chronic: Secondary | ICD-10-CM | POA: Diagnosis not present

## 2018-07-21 DIAGNOSIS — I208 Other forms of angina pectoris: Secondary | ICD-10-CM

## 2018-07-21 MED ORDER — BENZONATATE 200 MG PO CAPS: 200.0000 mg | ORAL_CAPSULE | Freq: Two times a day (BID) | ORAL | 0 refills | Status: DC | PRN

## 2018-07-21 MED ORDER — PSEUDOEPHEDRINE-GUAIFENESIN ER 120-1200 MG PO TB12: ORAL_TABLET | ORAL | 0 refills | Status: DC

## 2018-07-21 MED ORDER — AMOXICILLIN-POT CLAVULANATE 875-125 MG PO TABS: 1.0000 | ORAL_TABLET | Freq: Two times a day (BID) | ORAL | 0 refills | Status: DC

## 2018-07-21 NOTE — Progress Notes (Signed)
Chief Complaint  Patient presents with  . Fever  . Cough  . Nasal Congestion    HPI  Patient presents today with upper respiratory congestion. Rhinorrhea that is frequently purulent. There is moderate sore throat. Patient reports coughing frequently as well.  green sputum noted. There is  Reported fever. The patient denies being short of breath. Onset was 5 days ago. Gradually worsening. Tried OTCs without improvement.  PMH: Smoking status noted ROS: Per HPI  Objective: BP 133/84   Pulse (!) 116   Temp 97.9 F (36.6 C) (Oral)   Ht 6\' 2"  (1.88 m)   Wt 259 lb (117.5 kg)   BMI 33.25 kg/m  Gen: NAD, alert, cooperative with exam HEENT: NCAT, Nasal passages swollen, red TMS clear, maxillary sinuses tender CV: RRR, good S1/S2, no murmur Resp: Bronchitic changes with scattered wheezes, non-labored Ext: No edema, warm Neuro: Alert and oriented, No gross deficits  Assessment and plan:  1. Sinobronchitis     Meds ordered this encounter  Medications  . amoxicillin-clavulanate (AUGMENTIN) 875-125 MG tablet    Sig: Take 1 tablet by mouth 2 (two) times daily.    Dispense:  20 tablet    Refill:  0  . benzonatate (TESSALON) 200 MG capsule    Sig: Take 1 capsule (200 mg total) by mouth 2 (two) times daily as needed for cough.    Dispense:  20 capsule    Refill:  0  . Pseudoephedrine-Guaifenesin (MUCINEX D MAX STRENGTH) [phone] MG TB12    Sig: Take 1 tablet twice daily as needed for congestion    Dispense:  12 each    Refill:  0    No orders of the defined types were placed in this encounter.   Follow up as needed.  Claretta Fraise, MD

## 2018-09-01 ENCOUNTER — Telehealth: Payer: Self-pay | Admitting: Family Medicine

## 2018-09-09 ENCOUNTER — Ambulatory Visit (INDEPENDENT_AMBULATORY_CARE_PROVIDER_SITE_OTHER): Payer: Medicare Other | Admitting: Family

## 2018-09-09 ENCOUNTER — Encounter: Payer: Self-pay | Admitting: Family

## 2018-09-09 VITALS — BP 139/84 | HR 94 | Temp 96.8°F | Ht 74.0 in | Wt 268.2 lb

## 2018-09-09 DIAGNOSIS — I208 Other forms of angina pectoris: Secondary | ICD-10-CM

## 2018-09-09 DIAGNOSIS — J0100 Acute maxillary sinusitis, unspecified: Secondary | ICD-10-CM

## 2018-09-09 MED ORDER — AMOXICILLIN-POT CLAVULANATE 875-125 MG PO TABS: 1.0000 | ORAL_TABLET | Freq: Two times a day (BID) | ORAL | 0 refills | Status: DC

## 2018-09-09 MED ORDER — CETIRIZINE HCL 10 MG PO TABS: ORAL_TABLET | ORAL | 1 refills | Status: DC

## 2018-09-09 NOTE — Patient Instructions (Signed)

## 2018-09-09 NOTE — Progress Notes (Signed)
Subjective:    Patient ID: Edward Rivera, male    DOB: 1986-01-30, 32 y.o.   MRN: [id number]  Chief Complaint  Patient presents with  . Cough    x 3 days  . Nasal Congestion  . Headache    Cough  This is a new problem. The current episode started in the past 7 days. The problem has been gradually worsening. The problem occurs every few minutes. The cough is productive of sputum and productive of purulent sputum. Associated symptoms include chills, ear congestion, ear pain, headaches, nasal congestion, postnasal drip, rhinorrhea, a sore throat and wheezing. Pertinent negatives include no fever or myalgias. He has tried rest for the symptoms. The treatment provided mild relief.  Headache   Associated symptoms include coughing, ear pain, rhinorrhea and a sore throat. Pertinent negatives include no fever.      Review of Systems  Constitutional: Positive for chills. Negative for fever.  HENT: Positive for ear pain, postnasal drip, rhinorrhea and sore throat.   Respiratory: Positive for cough and wheezing.   Musculoskeletal: Negative for myalgias.  Neurological: Positive for headaches.  All other systems reviewed and are negative.      Objective:   Physical Exam  Constitutional: He is oriented to person, place, and time. He appears well-developed and well-nourished. No distress.  HENT:  Head: Normocephalic.  Right Ear: External ear normal. There is tenderness. Tympanic membrane is erythematous and bulging.  Left Ear: External ear normal.  Nose: Mucosal edema and rhinorrhea present. Right sinus exhibits maxillary sinus tenderness. Left sinus exhibits maxillary sinus tenderness.  Mouth/Throat: Posterior oropharyngeal erythema present.  Eyes: Pupils are equal, round, and reactive to light. Right eye exhibits no discharge. Left eye exhibits no discharge.  Neck: Normal range of motion. Neck supple. No thyromegaly present.  Cardiovascular: Normal rate, regular rhythm, normal heart  sounds and intact distal pulses.  No murmur heard. Pulmonary/Chest: Effort normal and breath sounds normal. No respiratory distress. He has no wheezes.  Abdominal: Soft. Bowel sounds are normal. He exhibits no distension. There is no tenderness.  Musculoskeletal: Normal range of motion. He exhibits no edema or tenderness.  Neurological: He is alert and oriented to person, place, and time. He has normal reflexes. No cranial nerve deficit.  Skin: Skin is warm and dry. No rash noted. No erythema.  Psychiatric: He has a normal mood and affect. His behavior is normal. Judgment and thought content normal.  Vitals reviewed.   BP 139/84   Pulse 94   Temp (!) 96.8 F (36 C) (Oral)   Ht 6\' 2"  (1.88 m)   Wt 268 lb 3.2 oz (121.7 kg)   BMI 34.43 kg/m      Assessment & Plan:  AKIM WATKINSON comes in today with chief complaint of Cough (x 3 days); Nasal Congestion; and Headache   Diagnosis and orders addressed:  1. Acute maxillary sinusitis, recurrence not specified - Take meds as prescribed - Use a cool mist humidifier  -Use saline nose sprays frequently -Force fluids -For any cough or congestion  Use plain Mucinex- regular strength or max strength is fine -For fever or aces or pains- take tylenol or ibuprofen. -Throat lozenges if help -New toothbrush in 3 days RTO if symptoms worsen or do not improve  - cetirizine (ZYRTEC) 10 MG tablet; Take 10mg  by mouth daily  Dispense: 90 tablet; Refill: 1 - amoxicillin-clavulanate (AUGMENTIN) 875-125 MG tablet; Take 1 tablet by mouth 2 (two) times daily.  Dispense: 14 tablet; Refill:  [place], [place]

## 2018-11-26 IMAGING — CT CT ABD-PELV W/ CM
2 of 4 series · 16 of 46 positions shown, 18 images · IV contrast (iopamidol)
Comparison: None.

CLINICAL DATA: Abd pain, 4 weeks after Cscope, guarding and
firmness on exam

EXAM:
CT ABDOMEN AND PELVIS WITH CONTRAST
TECHNIQUE: Multidetector CT imaging of the abdomen and pelvis was performed
using the standard protocol following bolus administration of
intravenous contrast.
CONTRAST:  100mL XVTBZ5-9SS IOPAMIDOL (XVTBZ5-9SS) INJECTION 61%

[Series 2: axial st · axial · 0.84mm/px · z∈[+802,+1317]mm · 13 of 115 slices shown, 15 images]
[im 6/115  soft-tissue]
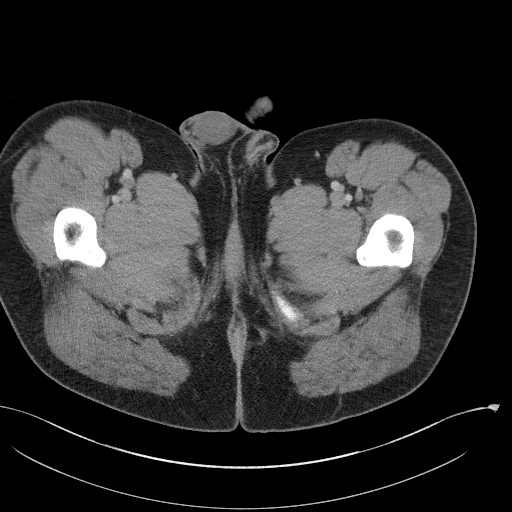
[im 6/115  bone]
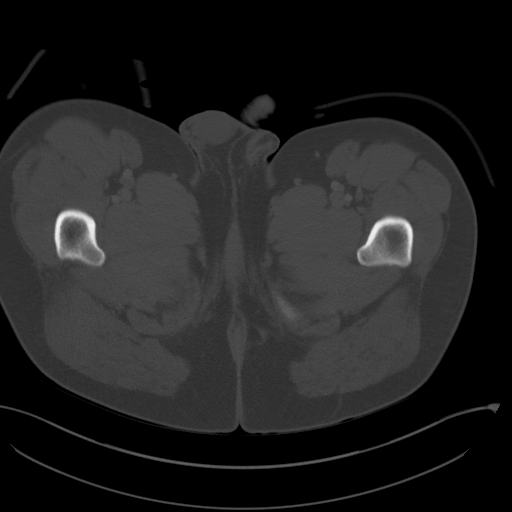
[im 16/115  soft-tissue]
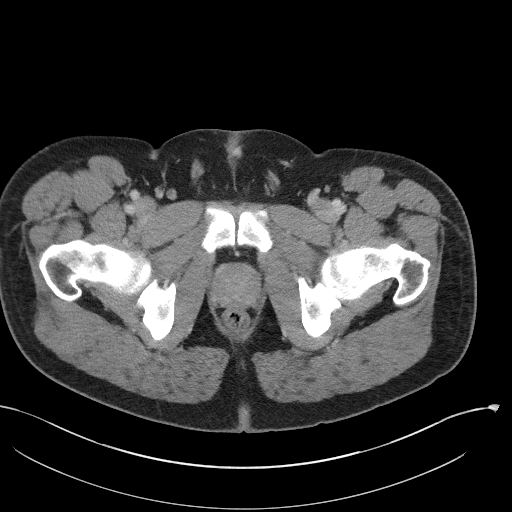
[im 26/115  soft-tissue]
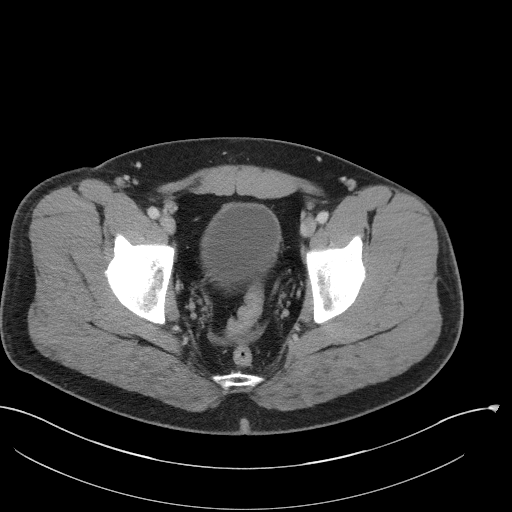
[im 32/115  soft-tissue]
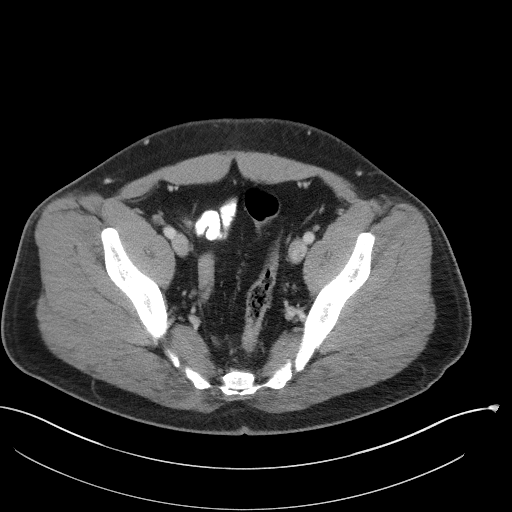
[im 42/115  soft-tissue]
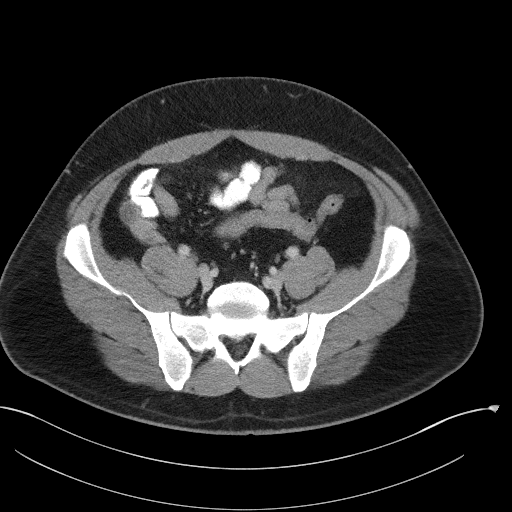
[im 47/115  soft-tissue]
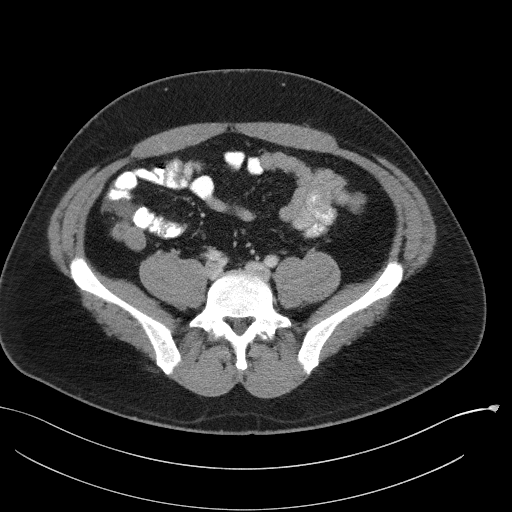
[im 58/115  soft-tissue]
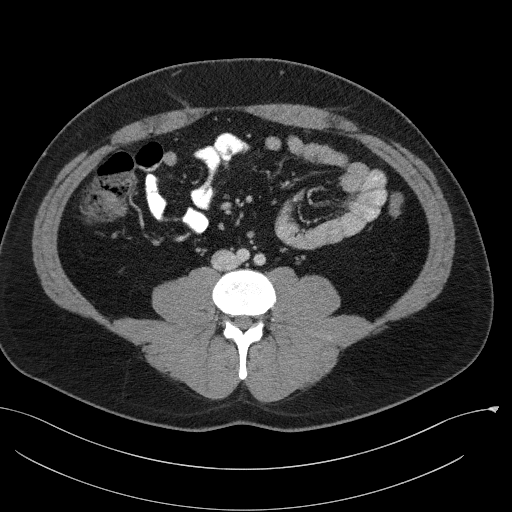
[im 68/115  soft-tissue]
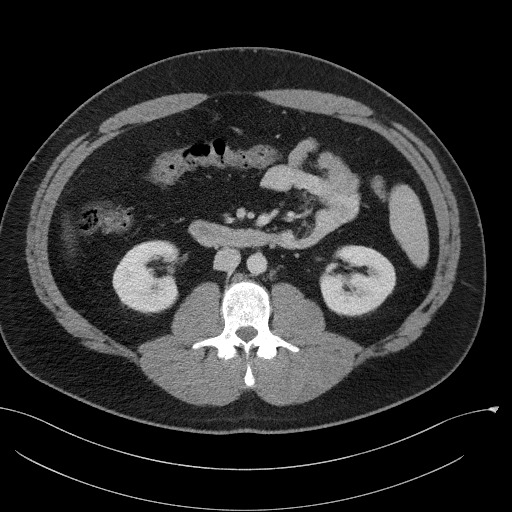
[im 73/115  soft-tissue]
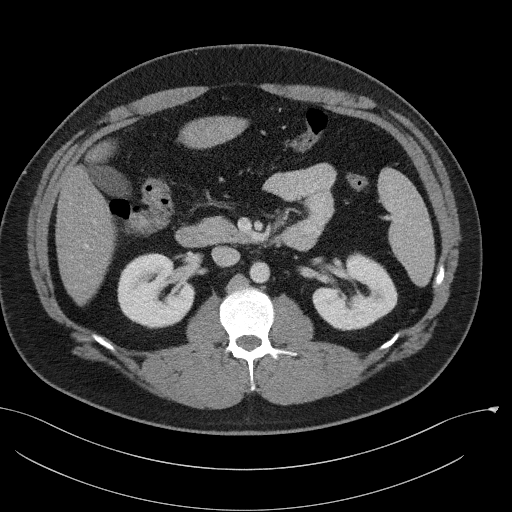
[im 73/115  bone]
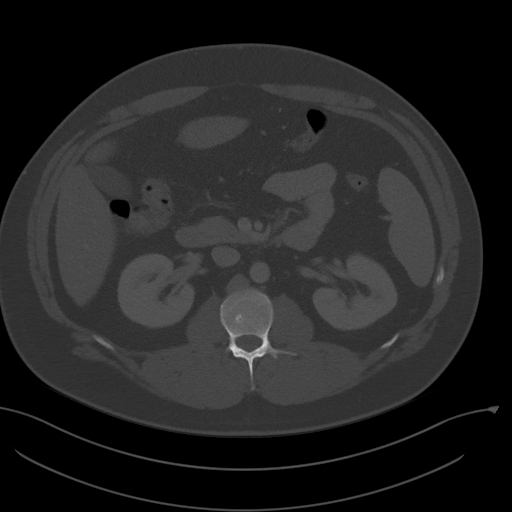
[im 83/115  soft-tissue]
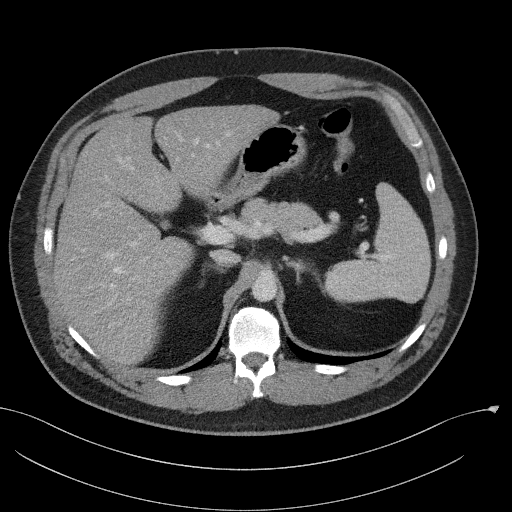
[im 89/115  soft-tissue]
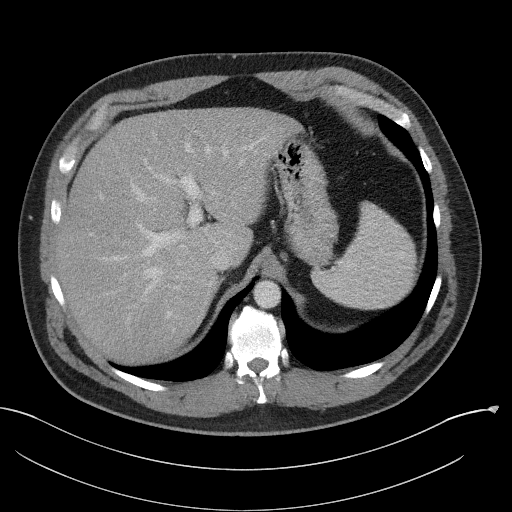
[im 99/115  soft-tissue]
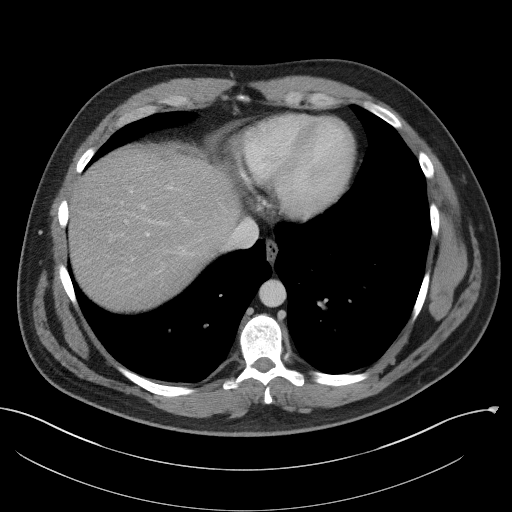
[im 109/115  soft-tissue]
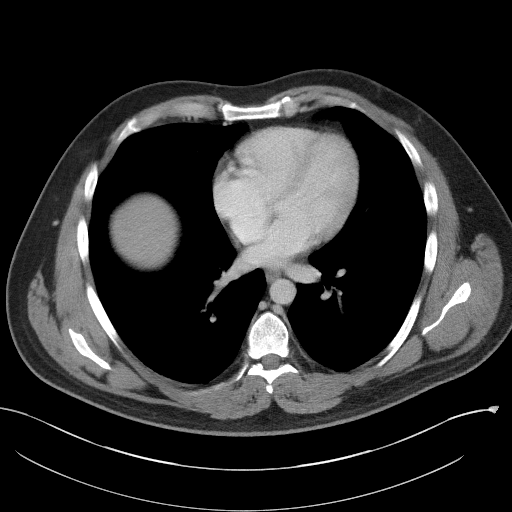

[Series 4: coronal st · coronal · 0.98mm/px · 3 of 117 slices shown]
[im 39/117  soft-tissue]
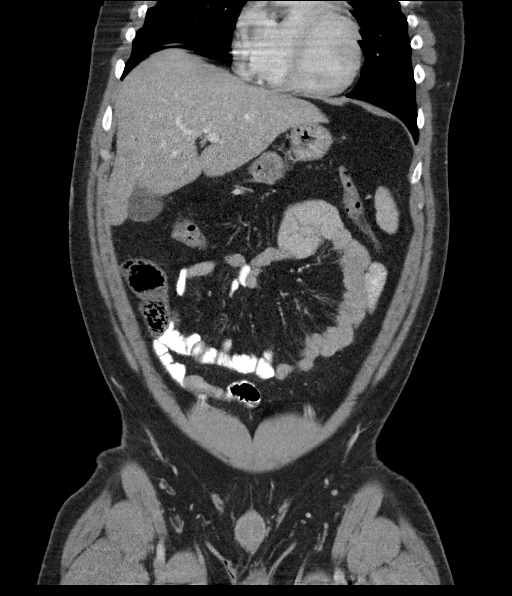
[im 52/117  soft-tissue]
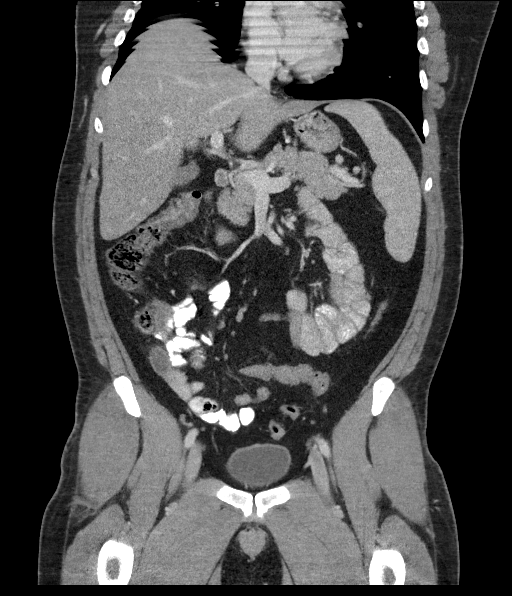
[im 65/117  soft-tissue]
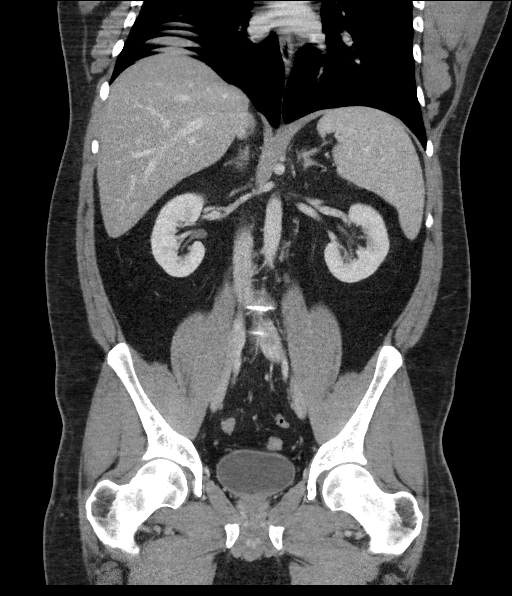

[16 of 46 positions shown; findings below may reference images not displayed]

FINDINGS: Lower chest: No acute abnormality.

Hepatobiliary: Diffuse hepatic steatosis.  Normal gallbladder.

Pancreas: Unremarkable pancreas.

Spleen: Unremarkable.

Adrenals/Urinary Tract: Normal adrenal glands. Normal kidneys.
Normal bladder.

Stomach/Bowel: The appendix is markedly distended measuring up to
1.9 cm and filled with low density material. There is stranding
adjacent to the appendix. No evidence of small-bowel obstruction. No
obvious mass in the colon. Normal stomach.

Vascular/Lymphatic: No evidence of aortic aneurysm. No abnormal
retroperitoneal adenopathy. Small right iliac lymph nodes.

Reproductive: Normal prostate.

Other: No free-fluid.  No free intraperitoneal gas.  No abscess.

Musculoskeletal: No vertebral compression deformity.
IMPRESSION: The appendix is distended and filled with low-density material.
There is some stranding adjacent to the appendix. Differential
diagnosis includes acute appendicitis or mucocele of the appendix.
And attempt to contact the referral position was unsuccessful. The
patient will be transported to the emergency room.

## 2019-01-07 ENCOUNTER — Other Ambulatory Visit: Payer: Self-pay | Admitting: *Deleted

## 2019-01-07 ENCOUNTER — Telehealth: Payer: Self-pay | Admitting: Family Medicine

## 2019-01-07 DIAGNOSIS — J0100 Acute maxillary sinusitis, unspecified: Secondary | ICD-10-CM

## 2019-01-07 MED ORDER — DICYCLOMINE HCL 20 MG PO TABS: 20.0000 mg | ORAL_TABLET | Freq: Three times a day (TID) | ORAL | 2 refills | Status: DC | PRN

## 2019-01-07 MED ORDER — CETIRIZINE HCL 10 MG PO TABS: 10.0000 mg | ORAL_TABLET | Freq: Every day | ORAL | 0 refills | Status: DC

## 2019-01-07 NOTE — Telephone Encounter (Signed)
Attempted to call patient but did not get through, because back please forward them to me.

## 2019-01-07 NOTE — Telephone Encounter (Signed)
[facility]  SWITCHBOARD  SICK CALL SCREENING   01/07/2019  Edward Rivera    LGS:[id number]    DOB:1986-05-12  Best Contact Telephone Number: [phone]   5. Symptoms: Coughing up dark green mucus.   2. Symptom Onset: Week and half  3. Have you traveled any over the past 14 days? No  4.   Have you been in recent contact with someone that has tested positive for COVID-19? No  5.   Which pharmacy would you use today if given a prescription? [facility] in [place] was informed that this information will be given to a clinical staff member to review and that they should receive a follow-up telephone call within 24 hours. he was advised to call back if he develops any new symptoms or if his current symptoms worsen.   Screened by: Cline Crock

## 2019-01-07 NOTE — Addendum Note (Signed)
Addended by: Antonietta Barcelona D on: 01/07/2019 03:59 PM   Modules accepted: Orders

## 2019-01-13 NOTE — Telephone Encounter (Signed)
Attempted to contact patient - No call back- encounter will be closed.

## 2019-05-15 ENCOUNTER — Other Ambulatory Visit: Payer: Self-pay | Admitting: Family

## 2019-05-15 DIAGNOSIS — J0100 Acute maxillary sinusitis, unspecified: Secondary | ICD-10-CM

## 2019-05-17 ENCOUNTER — Other Ambulatory Visit: Payer: Self-pay | Admitting: *Deleted

## 2019-05-23 ENCOUNTER — Other Ambulatory Visit: Payer: Self-pay | Admitting: Family Medicine

## 2019-05-23 MED ORDER — DICYCLOMINE HCL 20 MG PO TABS: 20.0000 mg | ORAL_TABLET | Freq: Three times a day (TID) | ORAL | 0 refills | Status: DC | PRN

## 2019-05-23 MED ORDER — OMEPRAZOLE 20 MG PO CPDR: 20.0000 mg | DELAYED_RELEASE_CAPSULE | Freq: Every day | ORAL | 0 refills | Status: DC

## 2019-05-23 NOTE — Telephone Encounter (Signed)
LMOVM refill sent to pharmacy. appt set for next month

## 2019-05-23 NOTE — Telephone Encounter (Signed)
What is the name of the medication? Bentyl , omeprazole   Have you contacted your pharmacy to request a refill? yes  Which pharmacy would you like this sent to? [facility] [place].  Patient has appt set up     Patient notified that their request is being sent to the clinical staff for review and that they should receive a call once it is complete. If they do not receive a call within 24 hours they can check with their pharmacy or our office.

## 2019-07-01 ENCOUNTER — Encounter: Payer: Self-pay | Admitting: *Deleted

## 2019-07-01 ENCOUNTER — Other Ambulatory Visit: Payer: Self-pay | Admitting: Family Medicine

## 2019-07-01 NOTE — Telephone Encounter (Signed)
My Chart message sent

## 2019-07-01 NOTE — Telephone Encounter (Signed)
Dettinger. NTBS 30 days given 05/23/19

## 2019-07-07 ENCOUNTER — Other Ambulatory Visit: Payer: Self-pay

## 2019-07-08 ENCOUNTER — Encounter: Payer: Self-pay | Admitting: Family Medicine

## 2019-07-08 ENCOUNTER — Ambulatory Visit (INDEPENDENT_AMBULATORY_CARE_PROVIDER_SITE_OTHER): Payer: Medicare Other | Admitting: Family Medicine

## 2019-07-08 VITALS — BP 128/80 | HR 91 | Temp 98.0°F | Resp 20 | Ht 74.0 in | Wt 267.8 lb

## 2019-07-08 DIAGNOSIS — G4719 Other hypersomnia: Secondary | ICD-10-CM | POA: Diagnosis not present

## 2019-07-08 DIAGNOSIS — K219 Gastro-esophageal reflux disease without esophagitis: Secondary | ICD-10-CM

## 2019-07-08 DIAGNOSIS — Z0000 Encounter for general adult medical examination without abnormal findings: Secondary | ICD-10-CM

## 2019-07-08 DIAGNOSIS — Z23 Encounter for immunization: Secondary | ICD-10-CM

## 2019-07-08 DIAGNOSIS — Z13220 Encounter for screening for lipoid disorders: Secondary | ICD-10-CM

## 2019-07-08 DIAGNOSIS — Z0001 Encounter for general adult medical examination with abnormal findings: Secondary | ICD-10-CM | POA: Diagnosis not present

## 2019-07-08 DIAGNOSIS — J0100 Acute maxillary sinusitis, unspecified: Secondary | ICD-10-CM | POA: Diagnosis not present

## 2019-07-08 MED ORDER — CETIRIZINE HCL 10 MG PO TABS: 10.0000 mg | ORAL_TABLET | Freq: Every day | ORAL | 3 refills | Status: DC

## 2019-07-08 MED ORDER — OMEPRAZOLE 20 MG PO CPDR: 20.0000 mg | DELAYED_RELEASE_CAPSULE | Freq: Every day | ORAL | 3 refills | Status: DC

## 2019-07-08 MED ORDER — FLUTICASONE PROPIONATE 50 MCG/ACT NA SUSP: 2.0000 | Freq: Every day | NASAL | 11 refills | Status: DC

## 2019-07-08 NOTE — Progress Notes (Signed)
BP 128/80   Pulse 91   Temp 98 F (36.7 C) (Temporal)   Resp 20   Ht '6\' 2"'$  (1.88 m)   Wt 267 lb 12.8 oz (121.5 kg)   SpO2 98%   BMI 34.38 kg/m    Subjective:   Patient ID: Edward Rivera, male    DOB: 1985-11-25, 33 y.o.   MRN: [id number]  HPI: Edward Rivera is a 33 y.o. male presenting on 07/08/2019 for Annual Exam   HPI Well adult exam and physical and recheck of GERD Patient is coming in for well adult exam and physical and checkup of chronic issues.  He says that he has been having issues with sleep since he was maybe [age] per his mom and his wife is said it is worsened.  He snores at night he is a heavy snorer and she is concerned that he will stop breathing at night.  Is also coming in for recheck of GERD, he currently takes the medicine he seems to be doing well on it.  Patient gets chronic sinus congestion and wants a refill of his allergy medication that helps with that  Relevant past medical, surgical, family and social history reviewed and updated as indicated. Interim medical history since our last visit reviewed. Allergies and medications reviewed and updated.  Review of Systems  Constitutional: Negative for chills and fever.  Respiratory: Negative for shortness of breath and wheezing.   Cardiovascular: Negative for chest pain and leg swelling.  Musculoskeletal: Negative for back pain and gait problem.  Skin: Negative for rash.  Neurological: Negative for dizziness, weakness and light-headedness.  All other systems reviewed and are negative.   Per HPI unless specifically indicated above   Allergies as of 07/08/2019   No Known Allergies     Medication List       Accurate as of July 08, 2019  1:18 PM. If you have any questions, ask your nurse or doctor.        STOP taking these medications   amoxicillin-clavulanate 875-125 MG tablet Commonly known as: AUGMENTIN Stopped by: Fransisca Kaufmann Edin Skarda, MD   dicyclomine 20 MG tablet Commonly known as:  Bentyl Stopped by: Fransisca Kaufmann Shaunice Levitan, MD   metoprolol tartrate 50 MG tablet Commonly known as: LOPRESSOR Stopped by: Worthy Rancher, MD     TAKE these medications   EQ Allergy Relief (Cetirizine) 10 MG tablet Generic drug: cetirizine Take 1 tablet by mouth once daily   fluticasone 50 MCG/ACT nasal spray Commonly known as: FLONASE Place 2 sprays into both nostrils daily.   ibuprofen 800 MG tablet Commonly known as: ADVIL Take 1 tablet (800 mg total) by mouth 3 (three) times daily.   multivitamin tablet Take 1 tablet by mouth daily.   omeprazole 20 MG capsule Commonly known as: PRILOSEC Take 1 capsule (20 mg total) by mouth daily. TAKE 1 CAPSULE BY MOUTH ONCE DAILY        Objective:   BP 128/80   Pulse 91   Temp 98 F (36.7 C) (Temporal)   Resp 20   Ht '6\' 2"'$  (1.88 m)   Wt 267 lb 12.8 oz (121.5 kg)   SpO2 98%   BMI 34.38 kg/m   Wt Readings from Last 3 Encounters:  07/08/19 267 lb 12.8 oz (121.5 kg)  09/09/18 268 lb 3.2 oz (121.7 kg)  07/21/18 259 lb (117.5 kg)    Physical Exam Vitals signs and nursing note reviewed.  Constitutional:  General: He is not in acute distress.    Appearance: He is well-developed. He is not diaphoretic.  Eyes:     General: No scleral icterus.    Conjunctiva/sclera: Conjunctivae normal.  Neck:     Musculoskeletal: Neck supple.     Thyroid: No thyromegaly.  Cardiovascular:     Rate and Rhythm: Normal rate and regular rhythm.     Heart sounds: Normal heart sounds. No murmur.  Pulmonary:     Effort: Pulmonary effort is normal. No respiratory distress.     Breath sounds: Normal breath sounds. No wheezing.  Abdominal:     General: Abdomen is flat. Bowel sounds are normal. There is no distension.     Tenderness: There is no abdominal tenderness.  Musculoskeletal: Normal range of motion.  Lymphadenopathy:     Cervical: No cervical adenopathy.  Skin:    General: Skin is warm and dry.     Findings: No rash.   Neurological:     Mental Status: He is alert and oriented to person, place, and time.     Coordination: Coordination normal.  Psychiatric:        Behavior: Behavior normal.     Assessment & Plan:   Problem List Items Addressed This Visit    None    Visit Diagnoses    Well adult exam    -  Primary   Acute maxillary sinusitis, recurrence not specified       Relevant Medications   fluticasone (FLONASE) 50 MCG/ACT nasal spray   cetirizine (EQ ALLERGY RELIEF, CETIRIZINE,) 10 MG tablet   Gastroesophageal reflux disease without esophagitis       Relevant Medications   omeprazole (PRILOSEC) 20 MG capsule   Other Relevant Orders   CBC with Differential/Platelet   CMP14+EGFR   Daytime hypersomnolence       Relevant Orders   Ambulatory referral to Sleep Studies   CMP14+EGFR   Lipid screening       Relevant Orders   Lipid panel        Follow up plan: Return in about 1 year (around 07/07/2020), or if symptoms worsen or fail to improve, for Well adult exam.  Counseling provided for all of the vaccine components No orders of the defined types were placed in this encounter.   Caryl Pina, MD [facility] 07/08/2019, 1:18 PM

## 2019-07-09 LAB — CBC WITH DIFFERENTIAL/PLATELET
Basophils Absolute: 0.1 10*3/uL (ref 0.0–0.2)
Basos: 1 %
EOS (ABSOLUTE): 0.2 10*3/uL (ref 0.0–0.4)
Eos: 3 %
Hematocrit: 47.9 % (ref 37.5–51.0)
Hemoglobin: 16.9 g/dL (ref 13.0–17.7)
Immature Grans (Abs): 0 10*3/uL (ref 0.0–0.1)
Immature Granulocytes: 0 %
Lymphocytes Absolute: 1.6 10*3/uL (ref 0.7–3.1)
Lymphs: 22 %
MCH: 31.2 pg (ref 26.6–33.0)
MCHC: 35.3 g/dL (ref 31.5–35.7)
MCV: 88 fL (ref 79–97)
Monocytes Absolute: 0.7 10*3/uL (ref 0.1–0.9)
Monocytes: 10 %
Neutrophils Absolute: 4.5 10*3/uL (ref 1.4–7.0)
Neutrophils: 64 %
Platelets: 351 10*3/uL (ref 150–450)
RBC: 5.42 x10E6/uL (ref 4.14–5.80)
RDW: 12.9 % (ref 11.6–15.4)
WBC: 7 10*3/uL (ref 3.4–10.8)

## 2019-07-09 LAB — CMP14+EGFR
ALT: 53 IU/L — ABNORMAL HIGH (ref 0–44)
AST: 27 IU/L (ref 0–40)
Albumin/Globulin Ratio: 1.6 (ref 1.2–2.2)
Albumin: 4.5 g/dL (ref 4.0–5.0)
Alkaline Phosphatase: 78 IU/L (ref 39–117)
BUN/Creatinine Ratio: 10 (ref 9–20)
BUN: 10 mg/dL (ref 6–20)
Bilirubin Total: 0.5 mg/dL (ref 0.0–1.2)
CO2: 24 mmol/L (ref 20–29)
Calcium: 9.7 mg/dL (ref 8.7–10.2)
Chloride: 100 mmol/L (ref 96–106)
Creatinine, Ser: 0.99 mg/dL (ref 0.76–1.27)
GFR calc Af Amer: 114 mL/min/{1.73_m2} (ref >59)
GFR calc non Af Amer: 98 mL/min/{1.73_m2} (ref >59)
Globulin, Total: 2.8 g/dL (ref 1.5–4.5)
Glucose: 91 mg/dL (ref 65–99)
Potassium: 4.5 mmol/L (ref 3.5–5.2)
Sodium: 140 mmol/L (ref 134–144)
Total Protein: 7.3 g/dL (ref 6.0–8.5)

## 2019-07-09 LAB — LIPID PANEL
Chol/HDL Ratio: 7.3 ratio — ABNORMAL HIGH (ref 0.0–5.0)
Cholesterol, Total: 197 mg/dL (ref 100–199)
HDL: 27 mg/dL — ABNORMAL LOW (ref >39)
LDL Chol Calc (NIH): 99 mg/dL (ref 0–99)
Triglycerides: 420 mg/dL — ABNORMAL HIGH (ref 0–149)
VLDL Cholesterol Cal: 71 mg/dL — ABNORMAL HIGH (ref 5–40)

## 2019-07-10 ENCOUNTER — Other Ambulatory Visit: Payer: Self-pay | Admitting: Family Medicine

## 2019-07-11 ENCOUNTER — Telehealth: Payer: Self-pay | Admitting: Family Medicine

## 2019-07-11 ENCOUNTER — Other Ambulatory Visit: Payer: Self-pay | Admitting: Family Medicine

## 2019-07-11 NOTE — Telephone Encounter (Signed)
Aware of results. 

## 2019-07-11 NOTE — Addendum Note (Signed)
Addended by: Shelbie Ammons on: 07/11/2019 11:31 AM   Modules accepted: Orders

## 2019-07-14 ENCOUNTER — Other Ambulatory Visit: Payer: Self-pay | Admitting: Family Medicine

## 2019-07-14 MED ORDER — DICYCLOMINE HCL 20 MG PO TABS: 20.0000 mg | ORAL_TABLET | Freq: Four times a day (QID) | ORAL | 1 refills | Status: DC

## 2019-07-14 NOTE — Telephone Encounter (Signed)
What is the name of the medication? RX for his IBS. Forgot the name of medication  Have you contacted your pharmacy to request a refill? Yes  Which pharmacy would you like this sent to? [facility] in [place]   Patient notified that their request is being sent to the clinical staff for review and that they should receive a call once it is complete. If they do not receive a call within 24 hours they can check with their pharmacy or our office.

## 2019-07-14 NOTE — Telephone Encounter (Signed)
I sent in dicyclomine which she has had previously for his IBS.

## 2019-07-14 NOTE — Telephone Encounter (Signed)
Patient aware.

## 2019-07-26 ENCOUNTER — Telehealth: Payer: Self-pay | Admitting: Family Medicine

## 2019-08-02 NOTE — Telephone Encounter (Signed)
Left message for patient to call [facility] where referral was sent.

## 2019-09-19 ENCOUNTER — Encounter: Payer: Self-pay | Admitting: Gastroenterology

## 2020-03-02 ENCOUNTER — Other Ambulatory Visit: Payer: Self-pay | Admitting: Family Medicine

## 2020-04-27 ENCOUNTER — Ambulatory Visit: Payer: Medicare Other | Admitting: Nurse Practitioner

## 2020-04-30 ENCOUNTER — Encounter: Payer: Self-pay | Admitting: Nurse Practitioner

## 2020-04-30 ENCOUNTER — Other Ambulatory Visit: Payer: Self-pay

## 2020-04-30 ENCOUNTER — Ambulatory Visit (INDEPENDENT_AMBULATORY_CARE_PROVIDER_SITE_OTHER): Payer: Medicaid Other | Admitting: Nurse Practitioner

## 2020-04-30 VITALS — BP 116/78 | HR 74 | Temp 96.7°F | Ht 74.0 in | Wt 256.6 lb

## 2020-04-30 DIAGNOSIS — K219 Gastro-esophageal reflux disease without esophagitis: Secondary | ICD-10-CM | POA: Insufficient documentation

## 2020-04-30 DIAGNOSIS — J0100 Acute maxillary sinusitis, unspecified: Secondary | ICD-10-CM | POA: Diagnosis not present

## 2020-04-30 DIAGNOSIS — F419 Anxiety disorder, unspecified: Secondary | ICD-10-CM | POA: Diagnosis not present

## 2020-04-30 MED ORDER — BUSPIRONE HCL 5 MG PO TABS: 5.0000 mg | ORAL_TABLET | Freq: Two times a day (BID) | ORAL | 0 refills | Status: DC

## 2020-04-30 MED ORDER — OMEPRAZOLE 20 MG PO CPDR: 20.0000 mg | DELAYED_RELEASE_CAPSULE | Freq: Every day | ORAL | 3 refills | Status: DC

## 2020-04-30 MED ORDER — DICYCLOMINE HCL 20 MG PO TABS: 20.0000 mg | ORAL_TABLET | Freq: Three times a day (TID) | ORAL | 0 refills | Status: DC

## 2020-04-30 MED ORDER — FLUTICASONE PROPIONATE 50 MCG/ACT NA SUSP: 2.0000 | Freq: Every day | NASAL | 11 refills | Status: DC

## 2020-04-30 NOTE — Patient Instructions (Addendum)
Gastroesophageal reflux disease without esophagitis Patient is a 34 year old male who presents to clinic for gastroesophageal reflux disease without esophagitis.  He was first diagnosed in 2019.  No changes has been made since last visit patient's medication.  Disease is well managed.  Patient is not reporting any new or worsening symptoms.  Reports excellent compliance with treatment, he is not having side effects from medication.  Patient will continue to follow instructions on eating less spicy, less greasy and heavy meals before bedtime and sitting up 1 hour or more before laying down. Refill medication sent to pharmacy Education provided to patient with written handouts given. Patient knows to follow-up with worsening or uncontrolled symptoms.  Anxiety Patient is a 34 year old male who presents to clinic today for generalized anxiety.  This is not new for patient, patient has had symptoms for the last 6 months.  He has excessive worrying, insomnia and increase daytime somnolence.  Patient reports worsening anxiety during the day and no changes since onset. Patient is reporting he is without a job and is looking for a job.  He denies current suicidal and homicidal ideation.  Patient has never been treated for anxiety. Provided education to patient with printed handout.  Discussed stress management. Started patient on BuSpar 5 mg twice daily.  Patient follow-up 4 weeks. Rx sent to pharmacy.    Gastroesophageal Reflux Disease, Adult Gastroesophageal reflux (GER) happens when acid from the stomach flows up into the tube that connects the mouth and the stomach (esophagus). Normally, food travels down the esophagus and stays in the stomach to be digested. With GER, food and stomach acid sometimes move back up into the esophagus. You may have a disease called gastroesophageal reflux disease (GERD) if the reflux:  Happens often.  Causes frequent or very bad symptoms.  Causes problems such as  damage to the esophagus. When this happens, the esophagus becomes sore and swollen (inflamed). Over time, GERD can make small holes (ulcers) in the lining of the esophagus. What are the causes? This condition is caused by a problem with the muscle between the esophagus and the stomach. When this muscle is weak or not normal, it does not close properly to keep food and acid from coming back up from the stomach. The muscle can be weak because of:  Tobacco use.  Pregnancy.  Having a certain type of hernia (hiatal hernia).  Alcohol use.  Certain foods and drinks, such as coffee, chocolate, onions, and peppermint. What increases the risk? You are more likely to develop this condition if you:  Are overweight.  Have a disease that affects your connective tissue.  Use NSAID medicines. What are the signs or symptoms? Symptoms of this condition include:  Heartburn.  Difficult or painful swallowing.  The feeling of having a lump in the throat.  A bitter taste in the mouth.  Bad breath.  Having a lot of saliva.  Having an upset or bloated stomach.  Belching.  Chest pain. Different conditions can cause chest pain. Make sure you see your doctor if you have chest pain.  Shortness of breath or noisy breathing (wheezing).  Ongoing (chronic) cough or a cough at night.  Wearing away of the surface of teeth (tooth enamel).  Weight loss. How is this treated? Treatment will depend on how bad your symptoms are. Your doctor may suggest:  Changes to your diet.  Medicine.  Surgery. Follow these instructions at home: Eating and drinking   Follow a diet as told by your doctor.  You may need to avoid foods and drinks such as: ? Coffee and tea (with or without caffeine). ? Drinks that contain alcohol. ? Energy drinks and sports drinks. ? Bubbly (carbonated) drinks or sodas. ? Chocolate and cocoa. ? Peppermint and mint flavorings. ? Garlic and onions. ? Horseradish. ? Spicy and  acidic foods. These include peppers, chili powder, curry powder, vinegar, hot sauces, and BBQ sauce. ? Citrus fruit juices and citrus fruits, such as oranges, lemons, and limes. ? Tomato-based foods. These include red sauce, chili, salsa, and pizza with red sauce. ? Fried and fatty foods. These include donuts, french fries, potato chips, and high-fat dressings. ? High-fat meats. These include hot dogs, rib eye steak, sausage, ham, and bacon. ? High-fat dairy items, such as whole milk, butter, and cream cheese.  Eat small meals often. Avoid eating large meals.  Avoid drinking large amounts of liquid with your meals.  Avoid eating meals during the 2-3 hours before bedtime.  Avoid lying down right after you eat.  Do not exercise right after you eat. Lifestyle   Do not use any products that contain nicotine or tobacco. These include cigarettes, e-cigarettes, and chewing tobacco. If you need help quitting, ask your doctor.  Try to lower your stress. If you need help doing this, ask your doctor.  If you are overweight, lose an amount of weight that is healthy for you. Ask your doctor about a safe weight loss goal. General instructions  Pay attention to any changes in your symptoms.  Take over-the-counter and prescription medicines only as told by your doctor. Do not take aspirin, ibuprofen, or other NSAIDs unless your doctor says it is okay.  Wear loose clothes. Do not wear anything tight around your waist.  Raise (elevate) the head of your bed about 6 inches (15 cm).  Avoid bending over if this makes your symptoms worse.  Keep all follow-up visits as told by your doctor. This is important. Contact a doctor if:  You have new symptoms.  You lose weight and you do not know why.  You have trouble swallowing or it hurts to swallow.  You have wheezing or a cough that keeps happening.  Your symptoms do not get better with treatment.  You have a hoarse voice. Get help right away  if:  You have pain in your arms, neck, jaw, teeth, or back.  You feel sweaty, dizzy, or light-headed.  You have chest pain or shortness of breath.  You throw up (vomit) and your throw-up looks like blood or coffee grounds.  You pass out (faint).  Your poop (stool) is bloody or black.  You cannot swallow, drink, or eat. Summary  If a person has gastroesophageal reflux disease (GERD), food and stomach acid move back up into the esophagus and cause symptoms or problems such as damage to the esophagus.  Treatment will depend on how bad your symptoms are.  Follow a diet as told by your doctor.  Take all medicines only as told by your doctor. This information is not intended to replace advice given to you by your health care provider. Make sure you discuss any questions you have with your health care provider. Document Revised: 04/14/2018 Document Reviewed: 04/14/2018 Elsevier Patient Education  [place]. Generalized Anxiety Disorder, Adult Generalized anxiety disorder (GAD) is a mental health disorder. People with this condition constantly worry about everyday events. Unlike normal anxiety, worry related to GAD is not triggered by a specific event. These worries also do not  fade or get better with time. GAD interferes with life functions, including relationships, work, and school. GAD can vary from mild to severe. People with severe GAD can have intense waves of anxiety with physical symptoms (panic attacks). What are the causes? The exact cause of GAD is not known. What increases the risk? This condition is more likely to develop in:  Women.  People who have a family history of anxiety disorders.  People who are very shy.  People who experience very stressful life events, such as the death of a loved one.  People who have a very stressful family environment. What are the signs or symptoms? People with GAD often worry excessively about many things in their lives,  such as their health and family. They may also be overly concerned about:  Doing well at work.  Being on time.  Natural disasters.  Friendships. Physical symptoms of GAD include:  Fatigue.  Muscle tension or having muscle twitches.  Trembling or feeling shaky.  Being easily startled.  Feeling like your heart is pounding or racing.  Feeling out of breath or like you cannot take a deep breath.  Having trouble falling asleep or staying asleep.  Sweating.  Nausea, diarrhea, or irritable bowel syndrome (IBS).  Headaches.  Trouble concentrating or remembering facts.  Restlessness.  Irritability. How is this diagnosed? Your health care provider can diagnose GAD based on your symptoms and medical history. You will also have a physical exam. The health care provider will ask specific questions about your symptoms, including how severe they are, when they started, and if they come and go. Your health care provider may ask you about your use of alcohol or drugs, including prescription medicines. Your health care provider may refer you to a mental health specialist for further evaluation. Your health care provider will do a thorough examination and may perform additional tests to rule out other possible causes of your symptoms. To be diagnosed with GAD, a person must have anxiety that:  Is out of his or her control.  Affects several different aspects of his or her life, such as work and relationships.  Causes distress that makes him or her unable to take part in normal activities.  Includes at least three physical symptoms of GAD, such as restlessness, fatigue, trouble concentrating, irritability, muscle tension, or sleep problems. Before your health care provider can confirm a diagnosis of GAD, these symptoms must be present more days than they are not, and they must last for six months or longer. How is this treated? The following therapies are usually used to treat  GAD:  Medicine. Antidepressant medicine is usually prescribed for long-term daily control. Antianxiety medicines may be added in severe cases, especially when panic attacks occur.  Talk therapy (psychotherapy). Certain types of talk therapy can be helpful in treating GAD by providing support, education, and guidance. Options include: ? Cognitive behavioral therapy (CBT). People learn coping skills and techniques to ease their anxiety. They learn to identify unrealistic or negative thoughts and behaviors and to replace them with positive ones. ? Acceptance and commitment therapy (ACT). This treatment teaches people how to be mindful as a way to cope with unwanted thoughts and feelings. ? Biofeedback. This process trains you to manage your body's response (physiological response) through breathing techniques and relaxation methods. You will work with a therapist while machines are used to monitor your physical symptoms.  Stress management techniques. These include yoga, meditation, and exercise. A mental health specialist can help determine  which treatment is best for you. Some people see improvement with one type of therapy. However, other people require a combination of therapies. Follow these instructions at home:  Take over-the-counter and prescription medicines only as told by your health care provider.  Try to maintain a normal routine.  Try to anticipate stressful situations and allow extra time to manage them.  Practice any stress management or self-calming techniques as taught by your health care provider.  Do not punish yourself for setbacks or for not making progress.  Try to recognize your accomplishments, even if they are small.  Keep all follow-up visits as told by your health care provider. This is important. Contact a health care provider if:  Your symptoms do not get better.  Your symptoms get worse.  You have signs of depression, such as: ? A persistently sad, cranky,  or irritable mood. ? Loss of enjoyment in activities that used to bring you joy. ? Change in weight or eating. ? Changes in sleeping habits. ? Avoiding friends or family members. ? Loss of energy for normal tasks. ? Feelings of guilt or worthlessness. Get help right away if:  You have serious thoughts about hurting yourself or others. If you ever feel like you may hurt yourself or others, or have thoughts about taking your own life, get help right away. You can go to your nearest emergency department or call:  Your local emergency services (911 in the [place]  A suicide crisis helpline, such as the [facility] at (610) 703-4897. This is open 24 hours a day. Summary  Generalized anxiety disorder (GAD) is a mental health disorder that involves worry that is not triggered by a specific event.  People with GAD often worry excessively about many things in their lives, such as their health and family.  GAD may cause physical symptoms such as restlessness, trouble concentrating, sleep problems, frequent sweating, nausea, diarrhea, headaches, and trembling or muscle twitching.  A mental health specialist can help determine which treatment is best for you. Some people see improvement with one type of therapy. However, other people require a combination of therapies. This information is not intended to replace advice given to you by your health care provider. Make sure you discuss any questions you have with your health care provider. Document Revised: 09/18/2017 Document Reviewed: 08/26/2016 Elsevier Patient Education  2020 [employer].

## 2020-04-30 NOTE — Assessment & Plan Note (Signed)
Patient is a 34 year old male who presents to clinic for gastroesophageal reflux disease without esophagitis.  He was first diagnosed in 2019.  No changes has been made since last visit patient's medication.  Disease is well managed.  Patient is not reporting any new or worsening symptoms.  Reports excellent compliance with treatment, he is not having side effects from medication.  Patient will continue to follow instructions on eating less spicy, less greasy and heavy meals before bedtime and sitting up 1 hour or more before laying down. Refill medication sent to pharmacy Education provided to patient with written handouts given. Patient knows to follow-up with worsening or uncontrolled symptoms.

## 2020-04-30 NOTE — Assessment & Plan Note (Signed)
Patient is a 34 year old male who presents to clinic today for generalized anxiety.  This is not new for patient, patient has had symptoms for the last 6 months.  He has excessive worrying, insomnia and increase daytime somnolence.  Patient reports worsening anxiety during the day and no changes since onset. Patient is reporting he is without a job and is looking for a job.  He denies current suicidal and homicidal ideation.  Patient has never been treated for anxiety. Provided education to patient with printed handout.  Discussed stress management. Started patient on BuSpar 5 mg twice daily.  Patient follow-up 4 weeks. Rx sent to pharmacy.

## 2020-04-30 NOTE — Progress Notes (Signed)
Established Patient Office Visit  Subjective:  Patient ID: Edward Rivera, male    DOB: 10/20/1986  Age: 34 y.o. MRN: [id number]  CC:  Chief Complaint  Patient presents with  . Anxiety  . Medication Refill    HPI Edward Rivera presents for Anxiety: Patient complains of anxiety.  He has the following symptoms: excessive worrying, insomnia but day time somnolence. Onset of symptoms was approximately all day and ongoing for more than 6 months. Patient reports he is without a job and is looking for a job. Anxiety is not improving and has not changed since onset. He denies current suicidal and homicidal ideation. Family history not significant for anxiety.Patient has never been treated for anxiety.    GERD, Follow up:  The patient was last seen for GERD in 2019  No changes had been made since last visit to patients medication. Patient is not reporting any new or worsening symptoms.   He reports excellent compliance with treatment. He is not having side effects from medication   -----------------------------------------------------------------------------------------  Past Medical History:  Diagnosis Date  . Allergy   . Celiac disease   . GERD (gastroesophageal reflux disease)   . Seizures ([place])    when patient was 34 years old    Past Surgical History:  Procedure Laterality Date  . COLONOSCOPY  06/2016  . HAND SURGERY Right   . LAPAROSCOPIC APPENDECTOMY N/A 11/11/2016   Procedure: APPENDECTOMY LAPAROSCOPIC;  Surgeon: Aviva Signs, MD;  Location: [facility];  Service: General;  Laterality: N/A;  . UPPER GASTROINTESTINAL ENDOSCOPY      Family History  Problem Relation Age of Onset  . Hypertension Father   . Early death Father        heart attacks  . Diabetes Maternal Grandmother   . Colon cancer Maternal Grandmother   . Diabetes Maternal Grandfather   . Diabetes Paternal Grandmother   . Breast cancer Paternal Grandmother   . Diabetes Paternal Grandfather   . Cancer Mother         colon and lung  . Hypertension Sister   . Heart disease Paternal Uncle   . Early death Maternal Uncle   . Esophageal cancer Neg Hx   . Pancreatic cancer Neg Hx   . Prostate cancer Neg Hx   . Rectal cancer Neg Hx   . Stomach cancer Neg Hx     Social History   Socioeconomic History  . Marital status: Married    Spouse name: Not on file  . Number of children: 2  . Years of education: Not on file  . Highest education level: Not on file  Occupational History  . Occupation: Disabled  Tobacco Use  . Smoking status: Never Smoker  . Smokeless tobacco: Never Used  Vaping Use  . Vaping Use: Never used  Substance and Sexual Activity  . Alcohol use: Yes    Alcohol/week: 7.0 standard drinks    Types: 7 Shots of liquor per week    Comment: 1 cocktail at night  . Drug use: No  . Sexual activity: Not on file  Other Topics Concern  . Not on file  Social History Narrative   Lives at home with wife and two children.   Social Determinants of Health   Financial Resource Strain:   . Difficulty of Paying Living Expenses:   Food Insecurity:   . Worried About Charity fundraiser in the Last Year:   . [place] in the Last Year:  Transportation Needs:   . Film/video editor (Medical):   Marland Kitchen Lack of Transportation (Non-Medical):   Physical Activity:   . Days of Exercise per Week:   . Minutes of Exercise per Session:   Stress:   . Feeling of Stress :   Social Connections:   . Frequency of Communication with Friends and Family:   . Frequency of Social Gatherings with Friends and Family:   . Attends Religious Services:   . Active Member of Clubs or Organizations:   . Attends Archivist Meetings:   Marland Kitchen Marital Status:   Intimate Partner Violence:   . Fear of Current or Ex-Partner:   . Emotionally Abused:   Marland Kitchen Physically Abused:   . Sexually Abused:     Outpatient Medications Prior to Visit  Medication Sig Dispense Refill  . cetirizine (EQ ALLERGY RELIEF,  CETIRIZINE,) 10 MG tablet Take 1 tablet (10 mg total) by mouth daily. 90 tablet 3  . dicyclomine (BENTYL) 20 MG tablet Take 1 tablet (20 mg total) by mouth 3 (three) times daily before meals. (Needs to be seen before next refill) 90 tablet 0  . fluticasone (FLONASE) 50 MCG/ACT nasal spray Place 2 sprays into both nostrils daily. 16 g 11  . Multiple Vitamin (MULTIVITAMIN) tablet Take 1 tablet by mouth daily.    Marland Kitchen omeprazole (PRILOSEC) 20 MG capsule Take 1 capsule (20 mg total) by mouth daily. TAKE 1 CAPSULE BY MOUTH ONCE DAILY 90 capsule 3  . ibuprofen (ADVIL,MOTRIN) 800 MG tablet Take 1 tablet (800 mg total) by mouth 3 (three) times daily. (Patient not taking: Reported on 07/08/2019) 21 tablet 0   No facility-administered medications prior to visit.    No Known Allergies  ROS Review of Systems  Constitutional: Negative.   HENT: Negative.   Eyes: Negative.   Respiratory: Negative.   Cardiovascular: Negative.   Gastrointestinal: Negative.   Endocrine: Negative.   Skin: Negative for color change and rash.  Neurological: Negative for light-headedness and headaches.       Increase somnolence   Psychiatric/Behavioral: Positive for sleep disturbance. Negative for confusion, self-injury and suicidal ideas. The patient is nervous/anxious.       Objective:    Physical Exam Constitutional:      Appearance: He is obese.  HENT:     Head: Normocephalic.  Cardiovascular:     Rate and Rhythm: Normal rate and regular rhythm.     Pulses: Normal pulses.     Heart sounds: Normal heart sounds.  Pulmonary:     Effort: Pulmonary effort is normal.     Breath sounds: Normal breath sounds.  Abdominal:     General: Bowel sounds are normal.  Musculoskeletal:     Cervical back: Normal range of motion and neck supple.  Skin:    General: Skin is warm.  Neurological:     Mental Status: He is alert and oriented to person, place, and time.     Comments: Increase somnolence  Psychiatric:      Comments: Anxiety      BP 116/78   Pulse 74   Temp (!) 96.7 F (35.9 C) (Temporal)   Ht 6\' 2"  (1.88 m)   Wt 256 lb 9.6 oz (116.4 kg)   SpO2 96%   BMI 32.95 kg/m  Wt Readings from Last 3 Encounters:  04/30/20 256 lb 9.6 oz (116.4 kg)  07/08/19 267 lb 12.8 oz (121.5 kg)  09/09/18 268 lb 3.2 oz (121.7 kg)     Health Maintenance  Due  Topic Date Due  . Hepatitis C Screening  Never done  . COLONOSCOPY  10/16/2019      Lab Results  Component Value Date   TSH 2.540 03/04/2016   Lab Results  Component Value Date   WBC 7.0 07/08/2019   HGB 16.9 07/08/2019   HCT 47.9 07/08/2019   MCV 88 07/08/2019   PLT 351 07/08/2019   Lab Results  Component Value Date   NA 140 07/08/2019   K 4.5 07/08/2019   CO2 24 07/08/2019   GLUCOSE 91 07/08/2019   BUN 10 07/08/2019   CREATININE 0.99 07/08/2019   BILITOT 0.5 07/08/2019   ALKPHOS 78 07/08/2019   AST 27 07/08/2019   ALT 53 (H) 07/08/2019   PROT 7.3 07/08/2019   ALBUMIN 4.5 07/08/2019   CALCIUM 9.7 07/08/2019   ANIONGAP 10 02/16/2018   Lab Results  Component Value Date   CHOL 197 07/08/2019   Lab Results  Component Value Date   HDL 27 (L) 07/08/2019   Lab Results  Component Value Date   LDLCALC 99 07/08/2019   Lab Results  Component Value Date   TRIG 420 (H) 07/08/2019   Lab Results  Component Value Date   CHOLHDL 7.3 (H) 07/08/2019     Office Visit from 04/30/2020 in [facility]  PHQ-9 Total Score 2        GAD 7 : Generalized Anxiety Score 04/30/2020  Nervous, Anxious, on Edge 3  Control/stop worrying 3  Worry too much - different things 2  Trouble relaxing 1  Restless 2  Anxiety Difficulty Somewhat difficult    Assessment & Plan:   Problem List Items Addressed This Visit      Digestive   Gastroesophageal reflux disease without esophagitis    Patient is a 34 year old male who presents to clinic for gastroesophageal reflux disease without esophagitis.  He was first diagnosed  in 2019.  No changes has been made since last visit patient's medication.  Disease is well managed.  Patient is not reporting any new or worsening symptoms.  Reports excellent compliance with treatment, he is not having side effects from medication.  Patient will continue to follow instructions on eating less spicy, less greasy and heavy meals before bedtime and sitting up 1 hour or more before laying down. Refill medication sent to pharmacy Education provided to patient with written handouts given. Patient knows to follow-up with worsening or uncontrolled symptoms.      Relevant Medications   omeprazole (PRILOSEC) 20 MG capsule   dicyclomine (BENTYL) 20 MG tablet     Other   Anxiety - Primary    Patient is a 34 year old male who presents to clinic today for generalized anxiety.  This is not new for patient, patient has had symptoms for the last 6 months.  He has excessive worrying, insomnia and increase daytime somnolence.  Patient reports worsening anxiety during the day and no changes since onset. Patient is reporting he is without a job and is looking for a job.  He denies current suicidal and homicidal ideation.  Patient has never been treated for anxiety. Provided education to patient with printed handout.  Discussed stress management. Started patient on BuSpar 5 mg twice daily.  Patient follow-up 4 weeks. Rx sent to pharmacy.      Relevant Medications   busPIRone (BUSPAR) 5 MG tablet    Other Visit Diagnoses    Acute maxillary sinusitis, recurrence not specified       Relevant Medications  fluticasone (FLONASE) 50 MCG/ACT nasal spray      Meds ordered this encounter  Medications  . fluticasone (FLONASE) 50 MCG/ACT nasal spray    Sig: Place 2 sprays into both nostrils daily.    Dispense:  16 g    Refill:  11  . omeprazole (PRILOSEC) 20 MG capsule    Sig: Take 1 capsule (20 mg total) by mouth daily. TAKE 1 CAPSULE BY MOUTH ONCE DAILY    Dispense:  90 capsule    Refill:  3   . dicyclomine (BENTYL) 20 MG tablet    Sig: Take 1 tablet (20 mg total) by mouth 3 (three) times daily before meals. (Needs to be seen before next refill)    Dispense:  90 tablet    Refill:  0  . busPIRone (BUSPAR) 5 MG tablet    Sig: Take 1 tablet (5 mg total) by mouth 2 (two) times daily.    Dispense:  60 tablet    Refill:  0    Order Specific Question:   Supervising Provider    Answer:   Caryl Pina A [[id number]]    Follow-up: Return in about 4 weeks (around 05/28/2020).    Ivy Lynn, NP

## 2020-06-11 ENCOUNTER — Ambulatory Visit: Payer: Medicaid Other | Admitting: Family

## 2020-06-12 ENCOUNTER — Other Ambulatory Visit: Payer: Self-pay | Admitting: Family Medicine

## 2020-06-12 ENCOUNTER — Other Ambulatory Visit: Payer: Self-pay

## 2020-06-12 ENCOUNTER — Encounter: Payer: Self-pay | Admitting: Family Medicine

## 2020-06-12 ENCOUNTER — Ambulatory Visit (INDEPENDENT_AMBULATORY_CARE_PROVIDER_SITE_OTHER): Payer: BC Managed Care – PPO | Admitting: Family Medicine

## 2020-06-12 VITALS — BP 127/80 | HR 113 | Temp 97.0°F

## 2020-06-12 DIAGNOSIS — J988 Other specified respiratory disorders: Secondary | ICD-10-CM | POA: Diagnosis not present

## 2020-06-12 DIAGNOSIS — K219 Gastro-esophageal reflux disease without esophagitis: Secondary | ICD-10-CM

## 2020-06-12 MED ORDER — AZITHROMYCIN 250 MG PO TABS: ORAL_TABLET | ORAL | 0 refills | Status: DC

## 2020-06-12 NOTE — Progress Notes (Signed)
Assessment & Plan:  1. Respiratory infection - Patient declined COVID-19 testing. Discussed symptom management. Tylenol/Ibuprofen for pain.  - azithromycin (ZITHROMAX Z-PAK) 250 MG tablet; Take 2 tablets (500 mg) PO today, then 1 tablet (250 mg) PO daily x4 days.  Dispense: 6 tablet; Refill: 0   Follow up plan: Return if symptoms worsen or fail to improve.  Hendricks Limes, MSN, APRN, FNP-C [facility]  Subjective:   Patient ID: Edward Rivera, male    DOB: 1985/12/21, 34 y.o.   MRN: [id number]  HPI: Edward Rivera is a 34 y.o. male presenting on 06/12/2020 for Sore Throat (x 4 days), Cough, and Ear Pain (right)  Patient complains of productive cough with clear sputum, sore throat and right ear pain. Additional symptoms include head congestion, runny nose and postnasal drainage. Onset of symptoms was 4 days ago, unchanged since that time. He is drinking plenty of fluids. Evaluation to date: none. Treatment to date: antihistamines. He does not smoke.     ROS: Negative unless specifically indicated above in HPI.   Relevant past medical history reviewed and updated as indicated.   Allergies and medications reviewed and updated.   Current Outpatient Medications:  .  busPIRone (BUSPAR) 5 MG tablet, Take 1 tablet (5 mg total) by mouth 2 (two) times daily., Disp: 60 tablet, Rfl: 0 .  cetirizine (EQ ALLERGY RELIEF, CETIRIZINE,) 10 MG tablet, Take 1 tablet (10 mg total) by mouth daily., Disp: 90 tablet, Rfl: 3 .  dicyclomine (BENTYL) 20 MG tablet, Take 1 tablet (20 mg total) by mouth 3 (three) times daily before meals. (Needs to be seen before next refill), Disp: 90 tablet, Rfl: 0 .  fluticasone (FLONASE) 50 MCG/ACT nasal spray, Place 2 sprays into both nostrils daily., Disp: 16 g, Rfl: 11 .  Multiple Vitamin (MULTIVITAMIN) tablet, Take 1 tablet by mouth daily., Disp: , Rfl:  .  omeprazole (PRILOSEC) 20 MG capsule, Take 1 capsule (20 mg total) by mouth daily. TAKE 1 CAPSULE  BY MOUTH ONCE DAILY, Disp: 90 capsule, Rfl: 3  No Known Allergies  Objective:   BP 127/80   Pulse (!) 113   Temp (!) 97 F (36.1 C) (Temporal)   SpO2 92%    Physical Exam Vitals reviewed.  Constitutional:      General: He is not in acute distress.    Appearance: Normal appearance. He is not ill-appearing, toxic-appearing or diaphoretic.  HENT:     Head: Normocephalic and atraumatic.     Right Ear: External ear normal. Tenderness present. No drainage. No middle ear effusion. There is no impacted cerumen. No foreign body. Tympanic membrane is erythematous and bulging. Tympanic membrane is not perforated or retracted.     Left Ear: Hearing, tympanic membrane, ear canal and external ear normal.     Nose: Rhinorrhea present. No congestion. Rhinorrhea is clear.     Right Sinus: No maxillary sinus tenderness or frontal sinus tenderness.     Left Sinus: No maxillary sinus tenderness or frontal sinus tenderness.     Mouth/Throat:     Mouth: Mucous membranes are moist. No oral lesions.     Pharynx: Posterior oropharyngeal erythema present. No pharyngeal swelling, oropharyngeal exudate or uvula swelling.  Eyes:     General: No scleral icterus.       Right eye: No discharge.        Left eye: No discharge.     Conjunctiva/sclera: Conjunctivae normal.  Cardiovascular:     Rate and Rhythm:  Normal rate.  Pulmonary:     Effort: Pulmonary effort is normal. No respiratory distress.  Musculoskeletal:        General: Normal range of motion.     Cervical back: Normal range of motion.  Skin:    General: Skin is warm and dry.  Neurological:     Mental Status: He is alert and oriented to person, place, and time. Mental status is at baseline.  Psychiatric:        Mood and Affect: Mood normal.        Behavior: Behavior normal.        Thought Content: Thought content normal.        Judgment: Judgment normal.

## 2020-07-13 ENCOUNTER — Other Ambulatory Visit: Payer: Self-pay

## 2020-07-13 ENCOUNTER — Encounter: Payer: Self-pay | Admitting: Family Medicine

## 2020-07-13 ENCOUNTER — Ambulatory Visit (INDEPENDENT_AMBULATORY_CARE_PROVIDER_SITE_OTHER): Payer: BC Managed Care – PPO | Admitting: Family Medicine

## 2020-07-13 DIAGNOSIS — F419 Anxiety disorder, unspecified: Secondary | ICD-10-CM | POA: Diagnosis not present

## 2020-07-13 DIAGNOSIS — K219 Gastro-esophageal reflux disease without esophagitis: Secondary | ICD-10-CM

## 2020-07-13 DIAGNOSIS — K529 Noninfective gastroenteritis and colitis, unspecified: Secondary | ICD-10-CM | POA: Diagnosis not present

## 2020-07-13 DIAGNOSIS — J0100 Acute maxillary sinusitis, unspecified: Secondary | ICD-10-CM | POA: Diagnosis not present

## 2020-07-13 MED ORDER — PREDNISONE 20 MG PO TABS: ORAL_TABLET | ORAL | 0 refills | Status: DC

## 2020-07-13 MED ORDER — CETIRIZINE HCL 10 MG PO TABS: 10.0000 mg | ORAL_TABLET | Freq: Every day | ORAL | 3 refills | Status: DC

## 2020-07-13 MED ORDER — BUSPIRONE HCL 10 MG PO TABS: 10.0000 mg | ORAL_TABLET | Freq: Two times a day (BID) | ORAL | 3 refills | Status: DC

## 2020-07-13 MED ORDER — OMEPRAZOLE 20 MG PO CPDR: 20.0000 mg | DELAYED_RELEASE_CAPSULE | Freq: Every day | ORAL | 3 refills | Status: DC

## 2020-07-13 MED ORDER — DICYCLOMINE HCL 20 MG PO TABS: 20.0000 mg | ORAL_TABLET | Freq: Three times a day (TID) | ORAL | 3 refills | Status: DC

## 2020-07-13 MED ORDER — FLUTICASONE PROPIONATE 50 MCG/ACT NA SUSP: 2.0000 | Freq: Every day | NASAL | 11 refills | Status: DC

## 2020-07-13 NOTE — Progress Notes (Signed)
Virtual Visit via telephone Note  I connected with Edward Rivera on 07/13/20 at 1451 by telephone and verified that I am speaking with the correct person using two identifiers. Edward Rivera is currently located at home and patient are currently with her during visit. The provider, Fransisca Kaufmann Braydn Carneiro, MD is located in their office at time of visit.  Call ended at 1501  I discussed the limitations, risks, security and privacy concerns of performing an evaluation and management service by telephone and the availability of in person appointments. I also discussed with the patient that there may be a patient responsible charge related to this service. The patient expressed understanding and agreed to proceed.   History and Present Illness: Patient is calling in for a recheck on anxiety and is on buspar 5 mg and has not noticed benefit and would like to increase the medication. He denies any suicidal ideations.   Chronic allergies  Patient takes zyrtec and flonase, he has been having congestion and drainage and dizziness and loss of appetite.  He tested covid negative.  This has been going on for 1 week.  He denies sinus headache or fevers or chills or shortness. He is down on energy and fatigued.   GERD Patient is currently on omeprazole.  She denies any major symptoms or abdominal pain or belching or burping. She denies any blood in her stool or lightheadedness or dizziness.   No diagnosis found.  Outpatient Encounter Medications as of 07/13/2020  Medication Sig  . azithromycin (ZITHROMAX Z-PAK) 250 MG tablet Take 2 tablets (500 mg) PO today, then 1 tablet (250 mg) PO daily x4 days.  . busPIRone (BUSPAR) 5 MG tablet Take 1 tablet (5 mg total) by mouth 2 (two) times daily.  . cetirizine (EQ ALLERGY RELIEF, CETIRIZINE,) 10 MG tablet Take 1 tablet (10 mg total) by mouth daily.  Marland Kitchen dicyclomine (BENTYL) 20 MG tablet TAKE 1 TABLET BY MOUTH THREE TIMES DAILY BEFORE MEAL(S) . APPOINTMENT REQUIRED FOR  FUTURE REFILLS  . fluticasone (FLONASE) 50 MCG/ACT nasal spray Place 2 sprays into both nostrils daily.  . Multiple Vitamin (MULTIVITAMIN) tablet Take 1 tablet by mouth daily.  Marland Kitchen omeprazole (PRILOSEC) 20 MG capsule Take 1 capsule by mouth once daily   No facility-administered encounter medications on file as of 07/13/2020.    Review of Systems  Constitutional: Negative for chills and fever.  HENT: Positive for congestion, postnasal drip, rhinorrhea, sinus pressure, sneezing and sore throat. Negative for ear discharge, ear pain and voice change.   Eyes: Negative for pain, discharge, redness and visual disturbance.  Respiratory: Positive for cough. Negative for shortness of breath and wheezing.   Cardiovascular: Negative for chest pain and leg swelling.  Musculoskeletal: Negative for gait problem.  Skin: Negative for rash.  Neurological: Positive for dizziness and light-headedness. Negative for weakness and numbness.  All other systems reviewed and are negative.   Observations/Objective: Patient sounds comfortable and in no acute distress  Assessment and Plan: Problem List Items Addressed This Visit      Digestive   Chronic diarrhea - Primary   Gastroesophageal reflux disease without esophagitis   Relevant Medications   dicyclomine (BENTYL) 20 MG tablet   omeprazole (PRILOSEC) 20 MG capsule     Other   Anxiety   Relevant Medications   busPIRone (BUSPAR) 10 MG tablet    Other Visit Diagnoses    Acute maxillary sinusitis, recurrence not specified       Relevant Medications  cetirizine (EQ ALLERGY RELIEF, CETIRIZINE,) 10 MG tablet   fluticasone (FLONASE) 50 MCG/ACT nasal spray   predniSONE (DELTASONE) 20 MG tablet       Follow up plan: Return in about 6 months (around 01/10/2021), or if symptoms worsen or fail to improve, for physcial.     I discussed the assessment and treatment plan with the patient. The patient was provided an opportunity to ask questions and all were  answered. The patient agreed with the plan and demonstrated an understanding of the instructions.   The patient was advised to call back or seek an in-person evaluation if the symptoms worsen or if the condition fails to improve as anticipated.  The above assessment and management plan was discussed with the patient. The patient verbalized understanding of and has agreed to the management plan. Patient is aware to call the clinic if symptoms persist or worsen. Patient is aware when to return to the clinic for a follow-up visit. Patient educated on when it is appropriate to go to the emergency department.    I provided 10 minutes of non-face-to-face time during this encounter.    Worthy Rancher, MD

## 2020-11-21 ENCOUNTER — Other Ambulatory Visit: Payer: Self-pay

## 2020-11-21 ENCOUNTER — Ambulatory Visit (INDEPENDENT_AMBULATORY_CARE_PROVIDER_SITE_OTHER): Payer: BC Managed Care – PPO | Admitting: Family Medicine

## 2020-11-21 ENCOUNTER — Encounter: Payer: Self-pay | Admitting: Family Medicine

## 2020-11-21 VITALS — BP 126/80 | HR 63 | Ht 74.0 in | Wt 265.0 lb

## 2020-11-21 DIAGNOSIS — Z3009 Encounter for other general counseling and advice on contraception: Secondary | ICD-10-CM

## 2020-11-21 DIAGNOSIS — Z23 Encounter for immunization: Secondary | ICD-10-CM

## 2020-11-21 NOTE — Progress Notes (Signed)
BP 126/80   Pulse 63   Ht 6\' 2"  (1.88 m)   Wt 265 lb (120.2 kg)   SpO2 98%   BMI 34.02 kg/m    Subjective:   Patient ID: Edward Rivera, male    DOB: 04/23/1986, 35 y.o.   MRN: [id number]  HPI: ANDRIK SANDT is a 35 y.o. male presenting on 11/21/2020 for Referral to urology (Discuss vasectomy/)   HPI Patient comes in to discuss referral for vasectomy.  Patient says he has 2 children and is very happy with 2 children and his wife is also happy with 2 children and they are ready to be done permanently and that is why is coming here for vasectomy.  Discussed some of the ins and outs of the procedure and where we would refer to and he is content with that.  Relevant past medical, surgical, family and social history reviewed and updated as indicated. Interim medical history since our last visit reviewed. Allergies and medications reviewed and updated.  Review of Systems  Constitutional: Negative for chills and fever.  Eyes: Negative for visual disturbance.  Respiratory: Negative for shortness of breath and wheezing.   Cardiovascular: Negative for chest pain and leg swelling.  Genitourinary: Negative for hematuria and testicular pain.  Skin: Negative for rash.  All other systems reviewed and are negative.   Per HPI unless specifically indicated above   Allergies as of 11/21/2020   No Known Allergies     Medication List       Accurate as of November 21, 2020  8:05 AM. If you have any questions, ask your nurse or doctor.        STOP taking these medications   predniSONE 20 MG tablet Commonly known as: DELTASONE Stopped by: Fransisca Kaufmann Loretha Ure, MD     TAKE these medications   busPIRone 10 MG tablet Commonly known as: BUSPAR Take 1 tablet (10 mg total) by mouth 2 (two) times daily.   cetirizine 10 MG tablet Commonly known as: EQ Allergy Relief (Cetirizine) Take 1 tablet (10 mg total) by mouth daily.   dicyclomine 20 MG tablet Commonly known as: BENTYL Take 1 tablet (20  mg total) by mouth 3 (three) times daily before meals.   fluticasone 50 MCG/ACT nasal spray Commonly known as: FLONASE Place 2 sprays into both nostrils daily.   multivitamin tablet Take 1 tablet by mouth daily.   omeprazole 20 MG capsule Commonly known as: PRILOSEC Take 1 capsule (20 mg total) by mouth daily.        Objective:   BP 126/80   Pulse 63   Ht 6\' 2"  (1.88 m)   Wt 265 lb (120.2 kg)   SpO2 98%   BMI 34.02 kg/m   Wt Readings from Last 3 Encounters:  11/21/20 265 lb (120.2 kg)  04/30/20 256 lb 9.6 oz (116.4 kg)  07/08/19 267 lb 12.8 oz (121.5 kg)    Physical Exam Vitals and nursing note reviewed.  Constitutional:      General: He is not in acute distress.    Appearance: He is well-developed and well-nourished. He is not diaphoretic.  Eyes:     General: No scleral icterus.    Extraocular Movements: EOM normal.     Conjunctiva/sclera: Conjunctivae normal.  Neck:     Thyroid: No thyromegaly.  Cardiovascular:     Pulses: Intact distal pulses.  Musculoskeletal:        General: No edema.  Skin:    General: Skin is  warm and dry.     Findings: No rash.  Neurological:     Mental Status: He is alert.  Psychiatric:        Mood and Affect: Mood and affect normal.       Assessment & Plan:   Problem List Items Addressed This Visit   None   Visit Diagnoses    Vasectomy evaluation    -  Primary   Relevant Orders   Ambulatory referral to Urology      Did referral for vasectomy for the patient. Follow up plan: No follow-ups on file.  Counseling provided for all of the vaccine components No orders of the defined types were placed in this encounter.   Caryl Pina, MD [facility] 11/21/2020, 8:05 AM

## 2021-01-18 ENCOUNTER — Other Ambulatory Visit: Payer: Self-pay | Admitting: Family Medicine

## 2021-01-18 DIAGNOSIS — F419 Anxiety disorder, unspecified: Secondary | ICD-10-CM

## 2021-02-15 ENCOUNTER — Other Ambulatory Visit: Payer: Self-pay | Admitting: Family Medicine

## 2021-02-15 DIAGNOSIS — F419 Anxiety disorder, unspecified: Secondary | ICD-10-CM

## 2021-03-17 ENCOUNTER — Other Ambulatory Visit: Payer: Self-pay | Admitting: Family Medicine

## 2021-03-17 DIAGNOSIS — F419 Anxiety disorder, unspecified: Secondary | ICD-10-CM

## 2021-03-28 ENCOUNTER — Ambulatory Visit (INDEPENDENT_AMBULATORY_CARE_PROVIDER_SITE_OTHER): Payer: BC Managed Care – PPO | Admitting: Nurse Practitioner

## 2021-03-28 DIAGNOSIS — R059 Cough, unspecified: Secondary | ICD-10-CM | POA: Diagnosis not present

## 2021-03-28 MED ORDER — BENZONATATE 100 MG PO CAPS: 100.0000 mg | ORAL_CAPSULE | Freq: Three times a day (TID) | ORAL | 0 refills | Status: DC | PRN

## 2021-03-28 MED ORDER — PREDNISONE 20 MG PO TABS: 40.0000 mg | ORAL_TABLET | Freq: Every day | ORAL | 0 refills | Status: AC

## 2021-03-28 NOTE — Progress Notes (Signed)
Virtual Visit  Note Due to COVID-19 pandemic this visit was conducted virtually. This visit type was conducted due to national recommendations for restrictions regarding the COVID-19 Pandemic (e.g. social distancing, sheltering in place) in an effort to limit this patient's exposure and mitigate transmission in our community. All issues noted in this document were discussed and addressed.  A physical exam was not performed with this format.  I connected with Detravion Tester Fabrizio on 03/28/21 at 1:13 by telephone and verified that I am speaking with the correct person using two identifiers. Keidrick Murty Petruska is currently located at home and no one is currently with him during visit. The provider, Mary-Margaret Hassell Done, FNP is located in their office at time of visit.  I discussed the limitations, risks, security and privacy concerns of performing an evaluation and management service by telephone and the availability of in person appointments. I also discussed with the patient that there may be a patient responsible charge related to this service. The patient expressed understanding and agreed to proceed.   History and Present Illness:   Chief Complaint: cough  HPI Patient calls in c/o cough and congestion that started 3 days ago.he did home covid test yesterday which was negative. Cough has gotten worse and bothers him more at night. He has been taking OTC cough meds from [facility]. Helps some.    Review of Systems  Constitutional:  Negative for chills and fever.  HENT:  Positive for congestion. Negative for sinus pain and sore throat.   Respiratory:  Positive for cough and sputum production. Negative for shortness of breath.   Musculoskeletal:  Negative for myalgias.  Neurological:  Negative for dizziness and headaches.    Observations/Objective: Alert and oriented- answers all questions appropriately No distress Dry cugh noted   Assessment and Plan: Tracen Mahler Buckholtz in today with chief complaint of  Cough   1. Cough 1. Take meds as prescribed 2. Use a cool mist humidifier especially during the winter months and when heat has been humid. 3. Use saline nose sprays frequently 4. Saline irrigations of the nose can be very helpful if done frequently.  * 4X daily for 1 week*  * Use of a nettie pot can be helpful with this. Follow directions with this* 5. Drink plenty of fluids 6. Keep thermostat turn down low 7.For any cough or congestion- tessalon perles as prescribed 8. For fever or aces or pains- take tylenol or ibuprofen appropriate for age and weight.  * for fevers greater than 101 orally you may alternate ibuprofen and tylenol every  3 hours.    Meds ordered this encounter  Medications   predniSONE (DELTASONE) 20 MG tablet    Sig: Take 2 tablets (40 mg total) by mouth daily with breakfast for 5 days. 2 po daily for 5 days    Dispense:  10 tablet    Refill:  0    Order Specific Question:   Supervising Provider    Answer:   Caryl Pina A [[id number]]   benzonatate (TESSALON PERLES) 100 MG capsule    Sig: Take 1 capsule (100 mg total) by mouth 3 (three) times daily as needed for cough.    Dispense:  20 capsule    Refill:  0    Order Specific Question:   Supervising Provider    Answer:   Caryl Pina A [[id number]]        Follow Up Instructions: prn    I discussed the assessment and treatment plan with the  patient. The patient was provided an opportunity to ask questions and all were answered. The patient agreed with the plan and demonstrated an understanding of the instructions.   The patient was advised to call back or seek an in-person evaluation if the symptoms worsen or if the condition fails to improve as anticipated.  The above assessment and management plan was discussed with the patient. The patient verbalized understanding of and has agreed to the management plan. Patient is aware to call the clinic if symptoms persist or worsen. Patient is aware when to  return to the clinic for a follow-up visit. Patient educated on when it is appropriate to go to the emergency department.   Time call ended:  1:25  I provided 12 minutes of  non face-to-face time during this encounter.    Mary-Margaret Hassell Done, FNP

## 2021-05-07 ENCOUNTER — Other Ambulatory Visit: Payer: Self-pay | Admitting: Family Medicine

## 2021-05-07 ENCOUNTER — Telehealth: Payer: Self-pay | Admitting: Family Medicine

## 2021-05-07 ENCOUNTER — Other Ambulatory Visit: Payer: Self-pay

## 2021-05-07 DIAGNOSIS — F419 Anxiety disorder, unspecified: Secondary | ICD-10-CM

## 2021-05-07 MED ORDER — BUSPIRONE HCL 10 MG PO TABS: 10.0000 mg | ORAL_TABLET | Freq: Two times a day (BID) | ORAL | 0 refills | Status: DC

## 2021-05-07 NOTE — Telephone Encounter (Signed)
1 refill sent to [facility] until pt is seen next month.

## 2021-05-07 NOTE — Telephone Encounter (Signed)
  Prescription Request  05/07/2021  What is the name of the medication or equipment? Buspar  Have you contacted your pharmacy to request a refill? (if applicable) Yes  Which pharmacy would you like this sent to?  [facility], [place]  Pt says he has been out of his Buspar Rx for 3 days and needs refill. Pt scheduled appt to see Dr Dettinger on 05/29/21 (first available appt slot)  Please advise and call patient.

## 2021-05-29 ENCOUNTER — Ambulatory Visit (INDEPENDENT_AMBULATORY_CARE_PROVIDER_SITE_OTHER): Payer: BC Managed Care – PPO | Admitting: Family Medicine

## 2021-05-29 ENCOUNTER — Other Ambulatory Visit: Payer: Self-pay

## 2021-05-29 ENCOUNTER — Encounter: Payer: Self-pay | Admitting: Family Medicine

## 2021-05-29 VITALS — BP 146/73 | HR 84 | Ht 74.0 in | Wt 266.0 lb

## 2021-05-29 DIAGNOSIS — Z9189 Other specified personal risk factors, not elsewhere classified: Secondary | ICD-10-CM | POA: Diagnosis not present

## 2021-05-29 DIAGNOSIS — K219 Gastro-esophageal reflux disease without esophagitis: Secondary | ICD-10-CM | POA: Diagnosis not present

## 2021-05-29 DIAGNOSIS — F419 Anxiety disorder, unspecified: Secondary | ICD-10-CM

## 2021-05-29 DIAGNOSIS — J029 Acute pharyngitis, unspecified: Secondary | ICD-10-CM

## 2021-05-29 DIAGNOSIS — Z13220 Encounter for screening for lipoid disorders: Secondary | ICD-10-CM

## 2021-05-29 LAB — RAPID STREP SCREEN (MED CTR MEBANE ONLY): Strep Gp A Ag, IA W/Reflex: NEGATIVE

## 2021-05-29 LAB — CULTURE, GROUP A STREP

## 2021-05-29 MED ORDER — BUSPIRONE HCL 15 MG PO TABS: 15.0000 mg | ORAL_TABLET | Freq: Two times a day (BID) | ORAL | 3 refills | Status: DC

## 2021-05-29 MED ORDER — OMEPRAZOLE 20 MG PO CPDR: 20.0000 mg | DELAYED_RELEASE_CAPSULE | Freq: Every day | ORAL | 3 refills | Status: DC

## 2021-05-29 NOTE — Progress Notes (Signed)
BP (!) 146/73   Pulse 84   Ht $R'6\' 2"'fJ$  (1.88 m)   Wt 266 lb (120.7 kg)   SpO2 96%   BMI 34.15 kg/m    Subjective:   Patient ID: Edward Rivera, male    DOB: 1986/05/20, 35 y.o.   MRN: [id number]  HPI: Edward Rivera is a 35 y.o. male presenting on 05/29/2021 for Medical Management of Chronic Issues   HPI Anxiety recheck Patient is coming in today for anxiety recheck.  He says he has been having more anxiety recently because he feels like the medications not doing as well as it used to.  He denies any major depression or suicidal ideations but just says he takes the medicine and 2 hours later he feels anxious again.  He denies any side effects from medication and feels like it does well for him but only last 2 hours now.  Patient does complain of pharyngitis currently.  He says that this is been bothering him more recently, he says he does have 2 children who were diagnosed with strep yesterday but he denies any fevers or chills cough or congestion.  Some drainage.  Strep test negative  Patient is saying that he has been having some apnea symptoms while sleeping and his wife since that she will get scared because he stops breathing and has to wake him up and has been happening multiple times a night and has been noticing it for quite some time.  He is coming in to get a referral for sleep apnea.  GERD Patient is currently on omeprazole.  She denies any major symptoms or abdominal pain or belching or burping. She denies any blood in her stool or lightheadedness or dizziness.   Relevant past medical, surgical, family and social history reviewed and updated as indicated. Interim medical history since our last visit reviewed. Allergies and medications reviewed and updated.  Review of Systems  Constitutional:  Negative for chills and fever.  HENT:  Positive for congestion, postnasal drip and sore throat. Negative for sinus pressure and sinus pain.   Eyes:  Negative for visual disturbance.   Respiratory:  Negative for cough, shortness of breath and wheezing.   Cardiovascular:  Negative for chest pain and leg swelling.  Musculoskeletal:  Negative for back pain and gait problem.  Skin:  Negative for rash.  Psychiatric/Behavioral:  Positive for sleep disturbance.   All other systems reviewed and are negative.  Per HPI unless specifically indicated above   Allergies as of 05/29/2021   No Known Allergies      Medication List        Accurate as of May 29, 2021  9:57 AM. If you have any questions, ask your nurse or doctor.          benzonatate 100 MG capsule Commonly known as: Tessalon Perles Take 1 capsule (100 mg total) by mouth 3 (three) times daily as needed for cough.   busPIRone 15 MG tablet Commonly known as: BUSPAR Take 1 tablet (15 mg total) by mouth 2 (two) times daily. What changed:  medication strength how much to take additional instructions Changed by: Fransisca Kaufmann Jalisia Puchalski, MD   cetirizine 10 MG tablet Commonly known as: EQ Allergy Relief (Cetirizine) Take 1 tablet (10 mg total) by mouth daily.   dicyclomine 20 MG tablet Commonly known as: BENTYL Take 1 tablet (20 mg total) by mouth 3 (three) times daily before meals.   fluticasone 50 MCG/ACT nasal spray Commonly known as: FLONASE  Place 2 sprays into both nostrils daily.   multivitamin tablet Take 1 tablet by mouth daily.   omeprazole 20 MG capsule Commonly known as: PRILOSEC Take 1 capsule (20 mg total) by mouth daily.         Objective:   BP (!) 146/73   Pulse 84   Ht $R'6\' 2"'wq$  (1.88 m)   Wt 266 lb (120.7 kg)   SpO2 96%   BMI 34.15 kg/m   Wt Readings from Last 3 Encounters:  05/29/21 266 lb (120.7 kg)  11/21/20 265 lb (120.2 kg)  04/30/20 256 lb 9.6 oz (116.4 kg)    Physical Exam Vitals and nursing note reviewed.  Constitutional:      General: He is not in acute distress.    Appearance: He is well-developed. He is not diaphoretic.  HENT:     Mouth/Throat:      Mouth: Mucous membranes are moist.     Pharynx: Oropharynx is clear. No oropharyngeal exudate or posterior oropharyngeal erythema.  Eyes:     General: No scleral icterus.    Conjunctiva/sclera: Conjunctivae normal.  Neck:     Thyroid: No thyromegaly.  Cardiovascular:     Rate and Rhythm: Normal rate and regular rhythm.     Heart sounds: Normal heart sounds. No murmur heard. Pulmonary:     Effort: Pulmonary effort is normal. No respiratory distress.     Breath sounds: Normal breath sounds. No wheezing.  Musculoskeletal:        General: Normal range of motion.     Cervical back: Neck supple.  Lymphadenopathy:     Cervical: No cervical adenopathy.  Skin:    General: Skin is warm and dry.     Findings: No rash.  Neurological:     Mental Status: He is alert and oriented to person, place, and time.     Coordination: Coordination normal.  Psychiatric:        Behavior: Behavior normal.      Assessment & Plan:   Problem List Items Addressed This Visit       Digestive   Gastroesophageal reflux disease without esophagitis   Relevant Medications   omeprazole (PRILOSEC) 20 MG capsule   Other Relevant Orders   CBC with Differential/Platelet   CMP14+EGFR     Other   Anxiety   Relevant Medications   busPIRone (BUSPAR) 15 MG tablet   Other Visit Diagnoses     Pharyngitis, unspecified etiology    -  Primary   Relevant Orders   Rapid Strep Screen ([facility] ONLY)   At risk for apnea       Relevant Orders   Ambulatory referral to Neurology   Lipid screening       Relevant Orders   Lipid panel       Patient has familial sleep apnea and then has had some episodes of his wife noticed where he stops breathing and she gets alarmed and has to wake him up. He says the BuSpar not working as well so would like to increase his dose.  He says it works but only last 2 hours and does not last as long as it did previously and he thinks that is because he is starting get used to  it. Follow up plan: Return in about 2 months (around 07/29/2021), or if symptoms worsen or fail to improve, for Anxiety recheck.  Counseling provided for all of the vaccine components Orders Placed This Encounter  Procedures   Rapid Strep Screen ([facility] ONLY)   CBC with Differential/Platelet   CMP14+EGFR   Lipid panel   Ambulatory referral to Neurology    Caryl Pina, MD [facility] 05/29/2021, 9:57 AM

## 2021-05-30 LAB — CMP14+EGFR
ALT: 127 IU/L — ABNORMAL HIGH (ref 0–44)
AST: 54 IU/L — ABNORMAL HIGH (ref 0–40)
Albumin/Globulin Ratio: 1.8 (ref 1.2–2.2)
Albumin: 4.6 g/dL (ref 4.0–5.0)
Alkaline Phosphatase: 82 IU/L (ref 44–121)
BUN/Creatinine Ratio: 8 — ABNORMAL LOW (ref 9–20)
BUN: 8 mg/dL (ref 6–20)
Bilirubin Total: 0.7 mg/dL (ref 0.0–1.2)
CO2: 23 mmol/L (ref 20–29)
Calcium: 9.8 mg/dL (ref 8.7–10.2)
Chloride: 101 mmol/L (ref 96–106)
Creatinine, Ser: 0.96 mg/dL (ref 0.76–1.27)
Globulin, Total: 2.6 g/dL (ref 1.5–4.5)
Glucose: 76 mg/dL (ref 65–99)
Potassium: 4.5 mmol/L (ref 3.5–5.2)
Sodium: 141 mmol/L (ref 134–144)
Total Protein: 7.2 g/dL (ref 6.0–8.5)
eGFR: 105 mL/min/{1.73_m2} (ref >59)

## 2021-05-30 LAB — CBC WITH DIFFERENTIAL/PLATELET
Basophils Absolute: 0.1 10*3/uL (ref 0.0–0.2)
Basos: 1 %
EOS (ABSOLUTE): 0.2 10*3/uL (ref 0.0–0.4)
Eos: 3 %
Hematocrit: 48.7 % (ref 37.5–51.0)
Hemoglobin: 17.1 g/dL (ref 13.0–17.7)
Immature Grans (Abs): 0 10*3/uL (ref 0.0–0.1)
Immature Granulocytes: 0 %
Lymphocytes Absolute: 1.7 10*3/uL (ref 0.7–3.1)
Lymphs: 19 %
MCH: 31.3 pg (ref 26.6–33.0)
MCHC: 35.1 g/dL (ref 31.5–35.7)
MCV: 89 fL (ref 79–97)
Monocytes Absolute: 0.8 10*3/uL (ref 0.1–0.9)
Monocytes: 9 %
Neutrophils Absolute: 6 10*3/uL (ref 1.4–7.0)
Neutrophils: 68 %
Platelets: 353 10*3/uL (ref 150–450)
RBC: 5.47 x10E6/uL (ref 4.14–5.80)
RDW: 12.4 % (ref 11.6–15.4)
WBC: 8.8 10*3/uL (ref 3.4–10.8)

## 2021-05-30 LAB — LIPID PANEL
Chol/HDL Ratio: 6.9 ratio — ABNORMAL HIGH (ref 0.0–5.0)
Cholesterol, Total: 213 mg/dL — ABNORMAL HIGH (ref 100–199)
HDL: 31 mg/dL — ABNORMAL LOW (ref >39)
LDL Chol Calc (NIH): 134 mg/dL — ABNORMAL HIGH (ref 0–99)
Triglycerides: 264 mg/dL — ABNORMAL HIGH (ref 0–149)
VLDL Cholesterol Cal: 48 mg/dL — ABNORMAL HIGH (ref 5–40)

## 2021-06-04 ENCOUNTER — Other Ambulatory Visit: Payer: Self-pay

## 2021-06-04 DIAGNOSIS — R748 Abnormal levels of other serum enzymes: Secondary | ICD-10-CM

## 2021-06-17 ENCOUNTER — Ambulatory Visit: Payer: BC Managed Care – PPO | Admitting: Family Medicine

## 2021-06-18 ENCOUNTER — Other Ambulatory Visit: Payer: Self-pay

## 2021-06-18 ENCOUNTER — Other Ambulatory Visit: Payer: BC Managed Care – PPO

## 2021-06-18 DIAGNOSIS — R748 Abnormal levels of other serum enzymes: Secondary | ICD-10-CM

## 2021-06-19 LAB — CMP14+EGFR
ALT: 109 IU/L — ABNORMAL HIGH (ref 0–44)
AST: 49 IU/L — ABNORMAL HIGH (ref 0–40)
Albumin/Globulin Ratio: 1.9 (ref 1.2–2.2)
Albumin: 4.5 g/dL (ref 4.0–5.0)
Alkaline Phosphatase: 78 IU/L (ref 44–121)
BUN/Creatinine Ratio: 10 (ref 9–20)
BUN: 9 mg/dL (ref 6–20)
Bilirubin Total: 0.9 mg/dL (ref 0.0–1.2)
CO2: 23 mmol/L (ref 20–29)
Calcium: 9.5 mg/dL (ref 8.7–10.2)
Chloride: 101 mmol/L (ref 96–106)
Creatinine, Ser: 0.94 mg/dL (ref 0.76–1.27)
Globulin, Total: 2.4 g/dL (ref 1.5–4.5)
Glucose: 93 mg/dL (ref 65–99)
Potassium: 4.3 mmol/L (ref 3.5–5.2)
Sodium: 140 mmol/L (ref 134–144)
Total Protein: 6.9 g/dL (ref 6.0–8.5)
eGFR: 108 mL/min/{1.73_m2} (ref >59)

## 2021-06-19 LAB — HEPATIC FUNCTION PANEL: Bilirubin, Direct: 0.21 mg/dL (ref 0.00–0.40)

## 2021-06-26 ENCOUNTER — Other Ambulatory Visit: Payer: Self-pay | Admitting: Family Medicine

## 2021-06-26 DIAGNOSIS — R748 Abnormal levels of other serum enzymes: Secondary | ICD-10-CM

## 2021-07-05 ENCOUNTER — Ambulatory Visit (HOSPITAL_COMMUNITY)
Admission: RE | Admit: 2021-07-05 | Discharge: 2021-07-05 | Disposition: A | Discharge status: Home / Self Care | Payer: BC Managed Care – PPO | Source: Ambulatory Visit | Attending: Family | Admitting: Family

## 2021-07-05 ENCOUNTER — Other Ambulatory Visit: Payer: Self-pay

## 2021-07-05 DIAGNOSIS — R748 Abnormal levels of other serum enzymes: Secondary | ICD-10-CM | POA: Insufficient documentation

## 2021-07-08 NOTE — Progress Notes (Signed)
Pt wants ultrasound results

## 2021-07-31 ENCOUNTER — Other Ambulatory Visit: Payer: Self-pay

## 2021-07-31 ENCOUNTER — Encounter: Payer: Self-pay | Admitting: Family Medicine

## 2021-07-31 ENCOUNTER — Ambulatory Visit (INDEPENDENT_AMBULATORY_CARE_PROVIDER_SITE_OTHER): Payer: BC Managed Care – PPO | Admitting: Family Medicine

## 2021-07-31 VITALS — BP 120/69 | HR 75 | Ht 74.0 in | Wt 244.0 lb

## 2021-07-31 DIAGNOSIS — Z9189 Other specified personal risk factors, not elsewhere classified: Secondary | ICD-10-CM | POA: Diagnosis not present

## 2021-07-31 DIAGNOSIS — F419 Anxiety disorder, unspecified: Secondary | ICD-10-CM | POA: Diagnosis not present

## 2021-07-31 NOTE — Progress Notes (Signed)
BP 120/69   Pulse 75   Ht 6\' 2"  (1.88 m)   Wt 244 lb (110.7 kg)   SpO2 98%   BMI 31.33 kg/m    Subjective:   Patient ID: Edward Rivera, male    DOB: [birth year], 35 y.o.   MRN: [id number]  HPI: Edward Rivera is a 35 y.o. male presenting on 07/31/2021 for Medical Management of Chronic Issues (47m follow up)   HPI Anxiety recheck Patient is taking the buspirone and feels like is doing very well.  He denies any major side effects from it.  He feels like his anxiety evening.  He feels like he is doing very well and has changed everything for him.  He is very happy with the medicine.  He denies any panic attacks or anxiety episodes.  He says works going well and family life is going well as well. Depression screen [facility] 2/9 07/31/2021 05/29/2021 11/21/2020 04/30/2020 07/08/2019  Decreased Interest 0 0 0 0 0  Down, Depressed, Hopeless 0 0 0 0 0  PHQ - 2 Score 0 0 0 0 0  Altered sleeping - 0 - 0 -  Tired, decreased energy - 2 - 0 -  Change in appetite - 2 - 0 -  Feeling bad or failure about yourself  - 0 - 0 -  Trouble concentrating - 0 - 0 -  Moving slowly or fidgety/restless - 0 - 2 -  Suicidal thoughts - 0 - 0 -  PHQ-9 Score - - - 2 -  Difficult doing work/chores - - - Not difficult at all -     Relevant past medical, surgical, family and social history reviewed and updated as indicated. Interim medical history since our last visit reviewed. Allergies and medications reviewed and updated.  Review of Systems  Constitutional:  Negative for chills and fever.  Eyes:  Negative for visual disturbance.  Respiratory:  Negative for shortness of breath and wheezing.   Cardiovascular:  Negative for chest pain and leg swelling.  Musculoskeletal:  Negative for back pain and gait problem.  Skin:  Negative for rash.  Neurological:  Negative for dizziness and light-headedness.  Psychiatric/Behavioral:  Negative for dysphoric mood. The patient is not nervous/anxious.   All other systems reviewed and  are negative.  Per HPI unless specifically indicated above   Allergies as of 07/31/2021   No Known Allergies      Medication List        Accurate as of July 31, 2021 11:29 AM. If you have any questions, ask your nurse or doctor.          benzonatate 100 MG capsule Commonly known as: Tessalon Perles Take 1 capsule (100 mg total) by mouth 3 (three) times daily as needed for cough.   busPIRone 15 MG tablet Commonly known as: BUSPAR Take 1 tablet (15 mg total) by mouth 2 (two) times daily.   cetirizine 10 MG tablet Commonly known as: EQ Allergy Relief (Cetirizine) Take 1 tablet (10 mg total) by mouth daily.   dicyclomine 20 MG tablet Commonly known as: BENTYL Take 1 tablet (20 mg total) by mouth 3 (three) times daily before meals.   fluticasone 50 MCG/ACT nasal spray Commonly known as: FLONASE Place 2 sprays into both nostrils daily.   multivitamin tablet Take 1 tablet by mouth daily.   omeprazole 20 MG capsule Commonly known as: PRILOSEC Take 1 capsule (20 mg total) by mouth daily.         Objective:  BP 120/69   Pulse 75   Ht 6\' 2"  (1.88 m)   Wt 244 lb (110.7 kg)   SpO2 98%   BMI 31.33 kg/m   Wt Readings from Last 3 Encounters:  07/31/21 244 lb (110.7 kg)  05/29/21 266 lb (120.7 kg)  11/21/20 265 lb (120.2 kg)    Physical Exam Vitals and nursing note reviewed.  Constitutional:      General: He is not in acute distress.    Appearance: He is well-developed. He is not diaphoretic.  Eyes:     General: No scleral icterus.    Conjunctiva/sclera: Conjunctivae normal.  Neck:     Thyroid: No thyromegaly.  Cardiovascular:     Rate and Rhythm: Normal rate and regular rhythm.     Heart sounds: Normal heart sounds. No murmur heard. Pulmonary:     Effort: Pulmonary effort is normal. No respiratory distress.     Breath sounds: Normal breath sounds. No wheezing.  Musculoskeletal:        General: Normal range of motion.     Cervical back: Neck  supple.  Lymphadenopathy:     Cervical: No cervical adenopathy.  Skin:    General: Skin is warm and dry.     Findings: No rash.  Neurological:     Mental Status: He is alert and oriented to person, place, and time.     Coordination: Coordination normal.  Psychiatric:        Behavior: Behavior normal.      Assessment & Plan:   Problem List Items Addressed This Visit       Other   Anxiety - Primary    Continue BuSpar, no changes Follow up plan: Return in about 6 months (around 01/29/2022), or if symptoms worsen or fail to improve, for Recheck anxiety and chronic medical issues.  Counseling provided for all of the vaccine components No orders of the defined types were placed in this encounter.   Caryl Pina, MD [facility] 07/31/2021, 11:29 AM

## 2021-07-31 NOTE — Addendum Note (Signed)
Addended by: Caryl Pina on: 07/31/2021 11:32 AM   Modules accepted: Orders

## 2021-08-17 ENCOUNTER — Other Ambulatory Visit: Payer: Self-pay | Admitting: Family Medicine

## 2021-08-17 DIAGNOSIS — J0100 Acute maxillary sinusitis, unspecified: Secondary | ICD-10-CM

## 2021-10-23 ENCOUNTER — Ambulatory Visit (INDEPENDENT_AMBULATORY_CARE_PROVIDER_SITE_OTHER): Payer: BC Managed Care – PPO | Admitting: Neurology

## 2021-10-23 ENCOUNTER — Encounter: Payer: Self-pay | Admitting: Neurology

## 2021-10-23 ENCOUNTER — Other Ambulatory Visit: Payer: Self-pay

## 2021-10-23 VITALS — BP 116/88 | HR 68 | Ht 74.0 in | Wt 233.0 lb

## 2021-10-23 DIAGNOSIS — F5105 Insomnia due to other mental disorder: Secondary | ICD-10-CM | POA: Diagnosis not present

## 2021-10-23 DIAGNOSIS — G4719 Other hypersomnia: Secondary | ICD-10-CM | POA: Diagnosis not present

## 2021-10-23 DIAGNOSIS — G478 Other sleep disorders: Secondary | ICD-10-CM

## 2021-10-23 DIAGNOSIS — Z9189 Other specified personal risk factors, not elsewhere classified: Secondary | ICD-10-CM

## 2021-10-23 DIAGNOSIS — R0681 Apnea, not elsewhere classified: Secondary | ICD-10-CM

## 2021-10-23 DIAGNOSIS — I208 Other forms of angina pectoris: Secondary | ICD-10-CM

## 2021-10-23 DIAGNOSIS — F99 Mental disorder, not otherwise specified: Secondary | ICD-10-CM

## 2021-10-23 DIAGNOSIS — K219 Gastro-esophageal reflux disease without esophagitis: Secondary | ICD-10-CM | POA: Diagnosis not present

## 2021-10-23 MED ORDER — TRIAMCINOLONE ACETONIDE 55 MCG/ACT NA AERO: 2.0000 | INHALATION_SPRAY | Freq: Every day | NASAL | 12 refills | Status: AC

## 2021-10-23 MED ORDER — TRAZODONE HCL 100 MG PO TABS: 100.0000 mg | ORAL_TABLET | Freq: Every day | ORAL | 5 refills | Status: AC

## 2021-10-23 NOTE — Progress Notes (Signed)
SLEEP MEDICINE CLINIC    Provider:  Larey Seat, MD  Primary Care Physician:  Dettinger, Fransisca Kaufmann, MD [place] [address]     Referring Provider: Dettinger, Fransisca Kaufmann, [place],  [place] [address]          Chief Complaint according to patient   Patient presents with:     New Patient (Initial Visit)     Pt states that he has had difficulty with his sleep for years. He has noticed jerking like sensation several times through the night. Wife has witnessed apnea events/ snoring. Never had a SS. Pt avg 3-4 hrs of sleep a night.       HISTORY OF PRESENT ILLNESS:  Edward Rivera is a 36 y.o.Caucasian male patient seen here as a referral on 10/23/2021 from Dr Dettinger  for a  sleep consultation.10-23-2021.  Chief concern according to patient : " I fall asleep on the lawn mower, while driving, (and whenever not physically active or mentally stimulated). I have been snoring and my wife witnessed apneas.  I am not a smoker, and I am sleepy since age 30".  Reports: Sleepiness all through school, had insomnia at night even at age 24.  Sleepwalking at that age through 36 years of age. He reports seizures 6-7 times a day in elementary school.  He went through [facility].  "The doctors didn't know what it was, so no medication was prescribed and my patients woke me up out of a seizure by splashing cold water over me. That stopped at age 58 or 55. "  Edward Rivera   has a past medical history of Allergy, Celiac disease, Fatty liver disease, nonalcoholic, GERD (gastroesophageal reflux disease), and Seizures ([place]). He was 275 pounds heavy and consuming a lot of fast food and sodas when diagnosed with fatty liver, NASH.   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 Not home schooling.   and lives in a household with 4 persons and 2 dogs.  Tobacco use;never .   ETOH use ; none, Caffeine intake in form of Coffee(  quit 3 months ago ) Soda( /) Tea ( 3-4 glasses ) or energy drinks.     Sleep habits are as follows: The patient's dinner time is between 5-6 PM. The patient goes to bed at 9 and falls asleep by 1 AM-  and continues to sleep for 2 hour intervals  wakes for frequent  bathroom breaks, the first time at 1.30 AM.   The preferred sleep position is reclined- elevated bed- 4 pillows (!) . Back pain. Dreams are reportedly rare/ .  6.30  AM is the usual rise time. The patient wakes up spontaneously/ by 3 AM - that would be 3 hours of sleep.   He reports not feeling refreshed or restored in AM, with symptoms such as dry mouth, morning headaches, and residual fatigue. Naps are taken frequently, many times a day lasting from 15 to 60 minutes and are more refreshing than nocturnal sleep.    Review of Systems: Out of a complete 14 system review, the patient complains of only the following symptoms, and all other reviewed systems are negative.:  Fatigue, sleepiness , snoring, fragmented sleep, Insomnia - chronic, cyclic and sleep initiation INSOMNIA_ -not organic.    In treatment for anxiety not depression.  How likely are you to doze in the following situations: 0 = not likely, 1 = slight chance, 2 = moderate chance, 3 = high chance   Sitting and Reading? Watching Television? Sitting inactive in a public place (theater or meeting)? As a passenger in a car for an hour without a break? Lying down in the afternoon when circumstances permit? Sitting and talking to someone? Sitting quietly after lunch without alcohol? In a car, while stopped for a few minutes in traffic?   Total =  18/ 24 points   FSS endorsed at 41/ 63 points.   Social History   Socioeconomic History   Marital status: Married    Spouse name: Not on  file   Number of children: 2   Years of education: Not on file   Highest education level: Not on file  Occupational History   Occupation: Disabled  Tobacco Use   Smoking status: Never   Smokeless tobacco: Never  Vaping Use   Vaping Use: Never used  Substance and Sexual Activity   Alcohol use: Not Currently    Alcohol/week: 7.0 standard drinks    Types: 7 Shots of liquor per week    Comment: 1 cocktail at night   Drug use: No   Sexual activity: Not on file  Other Topics Concern   Not on file  Social History Narrative   Lives at home with wife and two children.   Social Determinants of Health   Financial Resource Strain: Not on file  Food Insecurity: Not on file  Transportation Needs: Not on file  Physical Activity: Not on file  Stress: Not on file  Social Connections: Not on file    Family History  Problem Relation Age of Onset   Hypertension Father    Early death Father        heart attacks   Diabetes Maternal Grandmother    Colon cancer Maternal Grandmother    Diabetes Maternal Grandfather    Diabetes Paternal Grandmother    Breast cancer Paternal Grandmother    Diabetes Paternal Grandfather    Cancer Mother        colon and lung   Hypertension Sister    Heart disease Paternal Uncle    Early death Maternal Uncle    Esophageal cancer Neg Hx    Pancreatic cancer Neg Hx    Prostate cancer Neg Hx    Rectal cancer Neg Hx    Stomach cancer Neg Hx     Past Medical History:  Diagnosis Date   Allergy    Celiac disease    Fatty liver disease, nonalcoholic    GERD (gastroesophageal reflux disease)    Seizures ([place])    when patient was 36 years old    Past Surgical History:  Procedure Laterality Date   COLONOSCOPY  06/2016   HAND SURGERY Right    LAPAROSCOPIC APPENDECTOMY N/A 11/11/2016   Procedure: APPENDECTOMY LAPAROSCOPIC;  Surgeon: Aviva Signs, MD;  Location: [facility];  Service: General;  Laterality: N/A;   UPPER GASTROINTESTINAL ENDOSCOPY        Current Outpatient Medications on File Prior to Visit  Medication Sig Dispense Refill   benzonatate (TESSALON PERLES) 100 MG capsule Take 1 capsule (100 mg total) by mouth 3 (three) times daily as needed for cough. 20 capsule 0   busPIRone (BUSPAR) 15 MG tablet  Take 1 tablet (15 mg total) by mouth 2 (two) times daily. 180 tablet 3   cetirizine (ZYRTEC) 10 MG tablet Take 1 tablet by mouth once daily 90 tablet 0   dicyclomine (BENTYL) 20 MG tablet TAKE 1 TABLET BY MOUTH THREE TIMES DAILY BEFORE MEAL(S) 270 tablet 0   fluticasone (FLONASE) 50 MCG/ACT nasal spray Place 2 sprays into both nostrils daily. 16 g 11   Multiple Vitamin (MULTIVITAMIN) tablet Take 1 tablet by mouth daily.     omeprazole (PRILOSEC) 20 MG capsule Take 1 capsule (20 mg total) by mouth daily. 90 capsule 3   No current facility-administered medications on file prior to visit.    No Known Allergies  Physical exam:  Today's Vitals   10/23/21 0852  BP: 116/88  Pulse: 68  Weight: 233 lb (105.7 kg)  Height: _0  (1.88 m)   Body mass index is 29.92 kg/m.   Wt Readings from Last 3 Encounters:  10/23/21 233 lb (105.7 kg)  07/31/21 244 lb (110.7 kg)  05/29/21 266 lb (120.7 kg)     Ht Readings from Last 3 Encounters:  10/23/21 _1  (1.88 m)  07/31/21 _2  (1.88 m)  05/29/21 _3  (1.88 m)      General: The patient is awake, alert and appears not in acute distress. The patient is not well groomed. Head: Normocephalic, atraumatic.  Facial hair . Neck is supple. Mallampati 2,  neck circumference:18 inches . Nasal airflow not  patent.  right side obstructed -Retrognathia is not seen.  Dental status: biological  Cardiovascular:  Regular rate and cardiac rhythm by pulse,  without distended neck veins. Respiratory: Lungs are clear to auscultation.  Skin:  Without evidence of ankle edema, or rash. Trunk: The patient's posture is erect.   Neurologic exam : The patient is awake and alert, oriented to place and  time.   Memory subjective described as intact.  Attention span & concentration ability appears normal.  Speech is fluent,  without  dysarthria, dysphonia or aphasia.  Mood and affect are appropriate.   Cranial nerves: no loss of smell or taste reported  Pupils are equal and briskly reactive to light. Funduscopic exam deferred. .  Extraocular movements in vertical and horizontal planes were intact and without nystagmus. No Diplopia. Visual fields by finger perimetry are intact. Hearing was intact to soft voice and finger rubbing.    Facial sensation intact to fine touch.  Facial motor strength is symmetric and tongue and uvula move midline.  Neck ROM : rotation, tilt and flexion extension were normal for age and shoulder shrug was symmetrical.    Motor exam:  Symmetric bulk, tone and ROM.   Normal tone without cog- wheeling, symmetric grip strength .   Sensory:  Fine touch, pinprick and vibration were  normal.  Proprioception tested in the upper extremities was normal.   Coordination: Rapid alternating movements in the fingers/hands were of normal speed.  The Finger-to-nose maneuver was intact without evidence of ataxia, dysmetria or tremor.   Gait and station: Patient could rise unassisted from a seated position, walked without assistive device.  Stance is of normal width/ base .  Toe and heel walk were deferred.  Deep tendon reflexes: in the  upper and lower extremities are symmetric and intact, level 1, no clonus. .  Babinski response was deferred .     After spending a total time of  45  minutes face to face and additional time for physical and neurologic examination, review  of laboratory studies,  personal review of imaging studies, reports and results of other testing and review of referral information / records as far as provided in visit, I have established the following assessments:  1) patient with longstanding sleep problems , manifesting as sleep initiation insomnia,  undesired early arousal time and history of sleep walking with seizures in childhood ( face twitching, eye rolling back, body tremor, then followed by tonic clonic activity). He has never had a sleep aid.  2) excessive daytime sleepiness- SEVERE ! Fatigue is also very severe.  3) Witnessed snoring and apnea and daytime attacks of sleep.  Nasal congestion and septal deviation. 4) Insomnia of psychogenic origin.  5 family history paternal side of early heart attack, death of father at 72  and uncle by age 70.Both had OSA !    My Plan is to proceed with:  1) attended sleep study, with help of trazodone and melatonin OTC. SPLIT at [facility].  2) no vivid dreams, but sleep perception is off. He may not know when he is asleep, lucid dreaming. Medication may have suppressed his dreams/ REM sleep.  3) isolated case of sleep paralysis. jaw drops with sudden startles _ forme fruste of cataplexy? Order HLA narcolepsy panel.  4) INSOMNIA most likely related to psychiatric disorder.  50 nasal obstruction, start with nasocort nasal spray.  I would like to thank Dettinger, Fransisca Kaufmann, MD and Dettinger, Fransisca Kaufmann, Md [place],   [id number] for allowing me to meet with and to take care of this pleasant patient.    I plan to follow up either personally or through our NP within 2-4  month.   CC: I will share my notes with PCP .  Electronically signed by: Larey Seat, MD 10/23/2021 9:02 AM  [facility] Board certified by The [employer] of Sleep Medicine and Diplomate of the [employer] of Sleep Medicine. Board certified In Neurology through the [place], Fellow of the [employer] of Neurology. Medical Director of [facility].

## 2021-10-23 NOTE — Patient Instructions (Signed)
Screening for Sleep Apnea Sleep apnea is a condition in which breathing pauses or becomes shallow during sleep. Sleep apnea screening is a test to determine if you are at risk for sleep apnea. The test includes a series of questions. It will only takes a few minutes. Your health care provider may ask you to have this test in preparation for surgery or as part of a physical exam. What are the symptoms of sleep apnea? Common symptoms of sleep apnea include: Snoring. Waking up often at night. Daytime sleepiness. Pauses in breathing. Choking or gasping during sleep. Irritability. Forgetfulness. Trouble thinking clearly. Depression. Personality changes. Most people with sleep apnea do not know that they have it. What are the advantages of sleep apnea screening? Getting screened for sleep apnea can help: Ensure your safety. It is important for your health care providers to know whether or not you have sleep apnea, especially if you are having surgery or have other long-term (chronic) health conditions. Improve your health and allow you to get a better night's rest. Restful sleep can help you: Have more energy. Lose weight. Improve high blood pressure. Improve diabetes management. Prevent stroke. Prevent car accidents. What happens during the screening? Screening usually includes being asked a list of questions about your sleep quality. Some questions you may be asked include: Do you snore? Is your sleep restless? Do you have daytime sleepiness? Has a partner or spouse told you that you stop breathing during sleep? Have you had trouble concentrating or memory loss? What is your age? What is your neck circumference? To measure your neck, keep your back straight and gently wrap the tape measure around your neck. Put the tape measure at the middle of your neck, between your chin and collarbone. What is your sex assigned at birth? Do you have or are you being treated for high blood  pressure? If your screening test is positive, you are at risk for the condition. Further testing may be needed to confirm a diagnosis of sleep apnea. Where to find more information You can find screening tools online or at your health care clinic. For more information about sleep apnea screening and healthy sleep, visit these websites: Centers for Disease Control and Prevention: http://www.wolf.info/ American Sleep Apnea Association: www.sleepapnea.org Contact a health care provider if: You think that you may have sleep apnea. Summary Sleep apnea screening can help determine if you are at risk for sleep apnea. It is important for your health care providers to know whether or not you have sleep apnea, especially if you are having surgery or have other chronic health conditions. You may be asked to take a screening test for sleep apnea in preparation for surgery or as part of a physical exam. This information is not intended to replace advice given to you by your health care provider. Make sure you discuss any questions you have with your health care provider. Document Revised: 09/14/2020 Document Reviewed: 09/14/2020 Elsevier Patient Education  2022 [employer]. Insomnia Insomnia is a sleep disorder that makes it difficult to fall asleep or stay asleep. Insomnia can cause fatigue, low energy, difficulty concentrating, mood swings, and poor performance at work or school. There are three different ways to classify insomnia: Difficulty falling asleep. Difficulty staying asleep. Waking up too early in the morning. Any type of insomnia can be long-term (chronic) or short-term (acute). Both are common. Short-term insomnia usually lasts for three months or less. Chronic insomnia occurs at least three times a week for longer than three  months. What are the causes? Insomnia may be caused by another condition, situation, or substance, such as: Anxiety. Certain medicines. Gastroesophageal reflux disease (GERD)  or other gastrointestinal conditions. Asthma or other breathing conditions. Restless legs syndrome, sleep apnea, or other sleep disorders. Chronic pain. Menopause. Stroke. Abuse of alcohol, tobacco, or illegal drugs. Mental health conditions, such as depression. Caffeine. Neurological disorders, such as Alzheimer's disease. An overactive thyroid (hyperthyroidism). Sometimes, the cause of insomnia may not be known. What increases the risk? Risk factors for insomnia include: Gender. Women are affected more often than men. Age. Insomnia is more common as you get older. Stress. Lack of exercise. Irregular work schedule or working night shifts. Traveling between different time zones. Certain medical and mental health conditions. What are the signs or symptoms? If you have insomnia, the main symptom is having trouble falling asleep or having trouble staying asleep. This may lead to other symptoms, such as: Feeling fatigued or having low energy. Feeling nervous about going to sleep. Not feeling rested in the morning. Having trouble concentrating. Feeling irritable, anxious, or depressed. How is this diagnosed? This condition may be diagnosed based on: Your symptoms and medical history. Your health care provider may ask about: Your sleep habits. Any medical conditions you have. Your mental health. A physical exam. How is this treated? Treatment for insomnia depends on the cause. Treatment may focus on treating an underlying condition that is causing insomnia. Treatment may also include: Medicines to help you sleep. Counseling or therapy. Lifestyle adjustments to help you sleep better. Follow these instructions at home: Eating and drinking  Limit or avoid alcohol, caffeinated beverages, and cigarettes, especially close to bedtime. These can disrupt your sleep. Do not eat a large meal or eat spicy foods right before bedtime. This can lead to digestive discomfort that can make it  hard for you to sleep. Sleep habits  Keep a sleep diary to help you and your health care provider figure out what could be causing your insomnia. Write down: When you sleep. When you wake up during the night. How well you sleep. How rested you feel the next day. Any side effects of medicines you are taking. What you eat and drink. Make your bedroom a dark, comfortable place where it is easy to fall asleep. Put up shades or blackout curtains to block light from outside. Use a white noise machine to block noise. Keep the temperature cool. Limit screen use before bedtime. This includes: Watching TV. Using your smartphone, tablet, or computer. Stick to a routine that includes going to bed and waking up at the same times every day and night. This can help you fall asleep faster. Consider making a quiet activity, such as reading, part of your nighttime routine. Try to avoid taking naps during the day so that you sleep better at night. Get out of bed if you are still awake after 15 minutes of trying to sleep. Keep the lights down, but try reading or doing a quiet activity. When you feel sleepy, go back to bed. General instructions Take over-the-counter and prescription medicines only as told by your health care provider. Exercise regularly, as told by your health care provider. Avoid exercise starting several hours before bedtime. Use relaxation techniques to manage stress. Ask your health care provider to suggest some techniques that may work well for you. These may include: Breathing exercises. Routines to release muscle tension. Visualizing peaceful scenes. Make sure that you drive carefully. Avoid driving if you feel very sleepy. Keep  all follow-up visits as told by your health care provider. This is important. Contact a health care provider if: You are tired throughout the day. You have trouble in your daily routine due to sleepiness. You continue to have sleep problems, or your sleep  problems get worse. Get help right away if: You have serious thoughts about hurting yourself or someone else. If you ever feel like you may hurt yourself or others, or have thoughts about taking your own life, get help right away. You can go to your nearest emergency department or call: Your local emergency services (911 in the [place] A suicide crisis helpline, such as the [facility] at 205-462-6982 or 988 in the [place]. This is open 24 hours a day. Summary Insomnia is a sleep disorder that makes it difficult to fall asleep or stay asleep. Insomnia can be long-term (chronic) or short-term (acute). Treatment for insomnia depends on the cause. Treatment may focus on treating an underlying condition that is causing insomnia. Keep a sleep diary to help you and your health care provider figure out what could be causing your insomnia. This information is not intended to replace advice given to you by your health care provider. Make sure you discuss any questions you have with your health care provider. Document Revised: 05/01/2021 Document Reviewed: 08/16/2020 Elsevier Patient Education  2022 [employer].

## 2021-10-23 NOTE — Addendum Note (Signed)
Addended by: Larey Seat on: 10/23/2021 09:40 AM   Modules accepted: Orders

## 2021-10-23 NOTE — Progress Notes (Signed)
Holly notified us that pt missed doing labs prior to checking out. She called pt. He states he will try to stop by this week to complete. He already got on [address] and did not want to turn back around to complete today.

## 2021-11-07 ENCOUNTER — Telehealth: Payer: Self-pay | Admitting: Neurology

## 2021-11-07 NOTE — Telephone Encounter (Signed)
LVM for pt to call me back to schedule sleep study  

## 2021-11-12 ENCOUNTER — Telehealth: Payer: Self-pay | Admitting: Neurology

## 2021-11-12 NOTE — Telephone Encounter (Signed)
LVM for pt to call me back to schedule sleep study  

## 2021-11-21 ENCOUNTER — Telehealth: Payer: Self-pay

## 2021-11-21 NOTE — Telephone Encounter (Signed)
We have attempted to call the patient 2 times to schedule sleep study. Patient has been unavailable at the phone numbers we have on file and has not returned our calls. If patient calls back we will schedule them for their sleep study. ° °

## 2021-11-25 ENCOUNTER — Ambulatory Visit (INDEPENDENT_AMBULATORY_CARE_PROVIDER_SITE_OTHER): Payer: BC Managed Care – PPO | Admitting: Family Medicine

## 2021-11-25 DIAGNOSIS — J069 Acute upper respiratory infection, unspecified: Secondary | ICD-10-CM | POA: Diagnosis not present

## 2021-11-25 DIAGNOSIS — J020 Streptococcal pharyngitis: Secondary | ICD-10-CM

## 2021-11-25 MED ORDER — LORATADINE 10 MG PO TABS
10.0000 mg | ORAL_TABLET | Freq: Every day | ORAL | 11 refills | Status: AC
Start: 2021-11-25 — End: ?

## 2021-11-25 MED ORDER — AZITHROMYCIN 250 MG PO TABS: ORAL_TABLET | ORAL | 0 refills | Status: DC

## 2021-11-25 MED ORDER — BENZONATATE 100 MG PO CAPS: 100.0000 mg | ORAL_CAPSULE | Freq: Three times a day (TID) | ORAL | 0 refills | Status: DC | PRN

## 2021-11-25 MED ORDER — ONDANSETRON 4 MG PO TBDP
4.0000 mg | ORAL_TABLET | Freq: Three times a day (TID) | ORAL | 0 refills | Status: AC | PRN
Start: 2021-11-25 — End: ?

## 2021-11-25 NOTE — Progress Notes (Signed)
Telephone visit  Subjective: CC:URI w fever PCP: Dettinger, Fransisca Kaufmann, MD UTM:LYYT D Remmel is a 36 y.o. male calls for telephone consult today. Patient provides verbal consent for consult held via phone.  Due to COVID-19 pandemic this visit was conducted virtually. This visit type was conducted due to national recommendations for restrictions regarding the COVID-19 Pandemic (e.g. social distancing, sheltering in place) in an effort to limit this patient's exposure and mitigate transmission in our community. All issues noted in this document were discussed and addressed.  A physical exam was not performed with this format.   Location of patient: home Location of provider: [facility] Others present for call: none  1. URI Patient reports onset of cough, rhinorrhea, fever to 101.65F 3 days ago.  He notes the cough has become so severe he is getting abdominal pain.  Reports nausea, dry heaving.  No vomiting.  Reports nonbloody diarrhea.  Reports sick contacts, both children have strep throat and double ear infection. Using tylenol.   ROS: Per HPI  No Known Allergies Past Medical History:  Diagnosis Date   Allergy    Celiac disease    Fatty liver disease, nonalcoholic    GERD (gastroesophageal reflux disease)    Seizures ([place])    when patient was 36 years old    Current Outpatient Medications:    benzonatate (TESSALON PERLES) 100 MG capsule, Take 1 capsule (100 mg total) by mouth 3 (three) times daily as needed for cough., Disp: 20 capsule, Rfl: 0   busPIRone (BUSPAR) 15 MG tablet, Take 1 tablet (15 mg total) by mouth 2 (two) times daily., Disp: 180 tablet, Rfl: 3   cetirizine (ZYRTEC) 10 MG tablet, Take 1 tablet by mouth once daily, Disp: 90 tablet, Rfl: 0   dicyclomine (BENTYL) 20 MG tablet, TAKE 1 TABLET BY MOUTH THREE TIMES DAILY BEFORE MEAL(S), Disp: 270 tablet, Rfl: 0   fluticasone (FLONASE) 50 MCG/ACT nasal spray, Place 2 sprays into both nostrils daily., Disp: 16 g, Rfl: 11   Multiple  Vitamin (MULTIVITAMIN) tablet, Take 1 tablet by mouth daily., Disp: , Rfl:    omeprazole (PRILOSEC) 20 MG capsule, Take 1 capsule (20 mg total) by mouth daily., Disp: 90 capsule, Rfl: 3   traZODone (DESYREL) 100 MG tablet, Take 1 tablet (100 mg total) by mouth at bedtime., Disp: 30 tablet, Rfl: 5   triamcinolone (NASACORT) 55 MCG/ACT AERO nasal inhaler, Place 2 sprays into the nose daily., Disp: 1 each, Rfl: 12  Assessment/ Plan: 36 y.o. male   Viral URI with cough - Plan: benzonatate (TESSALON PERLES) 100 MG capsule, ondansetron (ZOFRAN-ODT) 4 MG disintegrating tablet, loratadine (CLARITIN) 10 MG tablet  Strep pharyngitis - Plan: azithromycin (ZITHROMAX) 250 MG tablet  Tessalon Perles sent for cough.  Zofran for nausea, Claritin for rhinorrhea.  I am going to empirically treat him with azithromycin given very close exposure to strep and constellation of symptoms.  He has had penicillin allergy so this was not prescribed.  We discussed red flag signs and symptoms warranting further evaluation.  Patient voiced good understanding will follow-up as needed  Start time: 1:06pm End time: 1:12pm  Total time spent on patient care (including telephone call/ virtual visit): 6 minutes  [place], [place] [phone]

## 2022-03-06 ENCOUNTER — Encounter: Payer: Self-pay | Admitting: Family Medicine

## 2022-03-06 ENCOUNTER — Ambulatory Visit (INDEPENDENT_AMBULATORY_CARE_PROVIDER_SITE_OTHER): Payer: BC Managed Care – PPO | Admitting: Family Medicine

## 2022-03-06 VITALS — BP 126/85 | HR 79 | Temp 97.6°F | Ht 74.0 in | Wt 256.0 lb

## 2022-03-06 DIAGNOSIS — F419 Anxiety disorder, unspecified: Secondary | ICD-10-CM | POA: Diagnosis not present

## 2022-03-06 DIAGNOSIS — F339 Major depressive disorder, recurrent, unspecified: Secondary | ICD-10-CM

## 2022-03-06 MED ORDER — SERTRALINE HCL 50 MG PO TABS: 50.0000 mg | ORAL_TABLET | Freq: Every day | ORAL | 5 refills | Status: DC

## 2022-03-06 NOTE — Progress Notes (Signed)
BP 126/85   Pulse 79   Temp 97.6 F (36.4 C)   Ht '6\' 2"'$  (1.88 m)   Wt 256 lb (116.1 kg)   SpO2 96%   BMI 32.87 kg/m    Subjective:   Patient ID: Edward Rivera, male    DOB: 1985/11/04, 37 y.o.   MRN: [id number]  HPI: Edward Rivera is a 36 y.o. male presenting on 03/06/2022 for Anxiety and Depression   HPI Anxiety depression recheck Patient takes trazodone in the evening to help with sleep, had buspirone but stopped it a couple weeks ago.  He has been unemployed and has been struggling a little bit more with feeling anxious and depressed since then and he is just more irritable and he is looking for jobs but has not found any good prospects as of yet.  He says the other medicine was is not working for him anymore, he was taking the buspirone.  He just says its been more irritable and anxious and just not feeling well.  He denies any suicidal ideations or thoughts of hurting himself.    03/06/2022   11:40 AM 03/06/2022   11:39 AM 07/31/2021   10:42 AM 05/29/2021   10:13 AM 05/29/2021   10:12 AM  Depression screen PHQ 2/9  Decreased Interest  0 0  0  Down, Depressed, Hopeless  0 0  0  PHQ - 2 Score  0 0  0  Altered sleeping 1   0   Tired, decreased energy 1   2   Change in appetite 1   2   Feeling bad or failure about yourself  2   0   Trouble concentrating 2   0   Moving slowly or fidgety/restless 3   0   Suicidal thoughts 0   0   Difficult doing work/chores Somewhat difficult         Relevant past medical, surgical, family and social history reviewed and updated as indicated. Interim medical history since our last visit reviewed. Allergies and medications reviewed and updated.  Review of Systems  Constitutional:  Negative for chills and fever.  Eyes:  Negative for visual disturbance.  Respiratory:  Negative for shortness of breath and wheezing.   Cardiovascular:  Negative for chest pain and leg swelling.  Musculoskeletal:  Negative for back pain and gait problem.  Skin:   Negative for rash.  Psychiatric/Behavioral:  Positive for dysphoric mood. Negative for decreased concentration, self-injury, sleep disturbance and suicidal ideas. The patient is nervous/anxious.   All other systems reviewed and are negative.  Per HPI unless specifically indicated above   Allergies as of 03/06/2022       Reactions   Penicillins Rash   No history of anaphylaxis        Medication List        Accurate as of Mar 06, 2022 12:01 PM. If you have any questions, ask your nurse or doctor.          STOP taking these medications    benzonatate 100 MG capsule Commonly known as: Best boy Stopped by: Worthy Rancher, MD   busPIRone 15 MG tablet Commonly known as: BUSPAR Stopped by: Fransisca Kaufmann Nathanel Tallman, MD       TAKE these medications    azithromycin 250 MG tablet Commonly known as: ZITHROMAX Take 2 tablets today, then take 1 tablet daily until gone.   dicyclomine 20 MG tablet Commonly known as: BENTYL TAKE 1 TABLET BY MOUTH THREE TIMES  DAILY BEFORE MEAL(S)   fluticasone 50 MCG/ACT nasal spray Commonly known as: FLONASE Place 2 sprays into both nostrils daily.   loratadine 10 MG tablet Commonly known as: CLARITIN Take 1 tablet (10 mg total) by mouth daily.   multivitamin tablet Take 1 tablet by mouth daily.   omeprazole 20 MG capsule Commonly known as: PRILOSEC Take 1 capsule (20 mg total) by mouth daily.   ondansetron 4 MG disintegrating tablet Commonly known as: ZOFRAN-ODT Take 1 tablet (4 mg total) by mouth every 8 (eight) hours as needed for nausea or vomiting.   sertraline 50 MG tablet Commonly known as: Zoloft Take 1 tablet (50 mg total) by mouth daily. Started by: Worthy Rancher, MD   traZODone 100 MG tablet Commonly known as: DESYREL Take 1 tablet (100 mg total) by mouth at bedtime.   triamcinolone 55 MCG/ACT Aero nasal inhaler Commonly known as: NASACORT Place 2 sprays into the nose daily.         Objective:    BP 126/85   Pulse 79   Temp 97.6 F (36.4 C)   Ht '6\' 2"'$  (1.88 m)   Wt 256 lb (116.1 kg)   SpO2 96%   BMI 32.87 kg/m   Wt Readings from Last 3 Encounters:  03/06/22 256 lb (116.1 kg)  10/23/21 233 lb (105.7 kg)  07/31/21 244 lb (110.7 kg)    Physical Exam Vitals and nursing note reviewed.  Constitutional:      General: He is not in acute distress.    Appearance: He is well-developed. He is not diaphoretic.  Eyes:     General: No scleral icterus.    Conjunctiva/sclera: Conjunctivae normal.  Neck:     Thyroid: No thyromegaly.  Cardiovascular:     Rate and Rhythm: Normal rate and regular rhythm.     Heart sounds: Normal heart sounds. No murmur heard. Pulmonary:     Effort: Pulmonary effort is normal. No respiratory distress.     Breath sounds: Normal breath sounds. No wheezing.  Musculoskeletal:        General: Normal range of motion.     Cervical back: Neck supple.  Lymphadenopathy:     Cervical: No cervical adenopathy.  Skin:    General: Skin is warm and dry.     Findings: No rash.  Neurological:     Mental Status: He is alert and oriented to person, place, and time.     Coordination: Coordination normal.  Psychiatric:        Behavior: Behavior normal.      Assessment & Plan:   Problem List Items Addressed This Visit       Other   Anxiety - Primary   Relevant Medications   sertraline (ZOLOFT) 50 MG tablet   Depression, recurrent (HCC)   Relevant Medications   sertraline (ZOLOFT) 50 MG tablet    We will try Zoloft and see if it helps him, started low-dose back in 1 to 2 months see how he is doing. Follow up plan: Return for 1 to 18-monthanxiety and depression follow-up.  Counseling provided for all of the vaccine components No orders of the defined types were placed in this encounter.   JCaryl Pina MD [facility] 03/06/2022, 12:01 PM

## 2022-05-09 ENCOUNTER — Ambulatory Visit (INDEPENDENT_AMBULATORY_CARE_PROVIDER_SITE_OTHER): Payer: BC Managed Care – PPO | Admitting: Family Medicine

## 2022-05-09 ENCOUNTER — Encounter: Payer: Self-pay | Admitting: Family Medicine

## 2022-05-09 VITALS — BP 123/74 | HR 78 | Temp 98.0°F | Ht 74.0 in | Wt 259.0 lb

## 2022-05-09 DIAGNOSIS — F419 Anxiety disorder, unspecified: Secondary | ICD-10-CM | POA: Diagnosis not present

## 2022-05-09 DIAGNOSIS — F339 Major depressive disorder, recurrent, unspecified: Secondary | ICD-10-CM

## 2022-05-09 NOTE — Progress Notes (Signed)
BP 123/74   Pulse 78   Temp 98 F (36.7 C)   Ht '6\' 2"'$  (1.88 m)   Wt 259 lb (117.5 kg)   SpO2 95%   BMI 33.25 kg/m    Subjective:   Patient ID: Edward Rivera, male    DOB: [birth year], 36 y.o.   MRN: [id number]  HPI: Edward Rivera is a 36 y.o. male presenting on 05/09/2022 for Medical Management of Chronic Issues and Anxiety   HPI Anxiety recheck Patient is currently taking zoloft and is happy with current dose.  Patient's says that he is doing well on his anxiety and feels like it is helpful, it has increased his appetite a little bit and he is going to watch that closely but otherwise he feels like he is doing very well.  Denies any suicidal ideations or thoughts of hurt himself.    03/06/2022   11:40 AM 03/06/2022   11:39 AM 07/31/2021   10:42 AM 05/29/2021   10:13 AM 05/29/2021   10:12 AM  Depression screen PHQ 2/9  Decreased Interest  0 0  0  Down, Depressed, Hopeless  0 0  0  PHQ - 2 Score  0 0  0  Altered sleeping 1   0   Tired, decreased energy 1   2   Change in appetite 1   2   Feeling bad or failure about yourself  2   0   Trouble concentrating 2   0   Moving slowly or fidgety/restless 3   0   Suicidal thoughts 0   0   Difficult doing work/chores Somewhat difficult         Relevant past medical, surgical, family and social history reviewed and updated as indicated. Interim medical history since our last visit reviewed. Allergies and medications reviewed and updated.  Review of Systems  Constitutional:  Negative for chills and fever.  Eyes:  Negative for visual disturbance.  Respiratory:  Negative for shortness of breath and wheezing.   Cardiovascular:  Negative for chest pain and leg swelling.  Musculoskeletal:  Negative for back pain and gait problem.  Skin:  Negative for rash.  Neurological:  Negative for dizziness, weakness and light-headedness.  Psychiatric/Behavioral:  Negative for decreased concentration, dysphoric mood, self-injury, sleep disturbance and  suicidal ideas. The patient is not nervous/anxious.   All other systems reviewed and are negative.   Per HPI unless specifically indicated above   Allergies as of 05/09/2022       Reactions   Penicillins Rash   No history of anaphylaxis        Medication List        Accurate as of May 09, 2022 10:22 AM. If you have any questions, ask your nurse or doctor.          STOP taking these medications    azithromycin 250 MG tablet Commonly known as: ZITHROMAX Stopped by: Fransisca Kaufmann Yaira Bernardi, MD       TAKE these medications    dicyclomine 20 MG tablet Commonly known as: BENTYL TAKE 1 TABLET BY MOUTH THREE TIMES DAILY BEFORE MEAL(S)   fluticasone 50 MCG/ACT nasal spray Commonly known as: FLONASE Place 2 sprays into both nostrils daily.   loratadine 10 MG tablet Commonly known as: CLARITIN Take 1 tablet (10 mg total) by mouth daily.   multivitamin tablet Take 1 tablet by mouth daily.   omeprazole 20 MG capsule Commonly known as: PRILOSEC Take 1 capsule (20 mg total) by  mouth daily.   ondansetron 4 MG disintegrating tablet Commonly known as: ZOFRAN-ODT Take 1 tablet (4 mg total) by mouth every 8 (eight) hours as needed for nausea or vomiting.   sertraline 50 MG tablet Commonly known as: Zoloft Take 1 tablet (50 mg total) by mouth daily.   traZODone 100 MG tablet Commonly known as: DESYREL Take 1 tablet (100 mg total) by mouth at bedtime.   triamcinolone 55 MCG/ACT Aero nasal inhaler Commonly known as: NASACORT Place 2 sprays into the nose daily.         Objective:   BP 123/74   Pulse 78   Temp 98 F (36.7 C)   Ht '6\' 2"'$  (1.88 m)   Wt 259 lb (117.5 kg)   SpO2 95%   BMI 33.25 kg/m   Wt Readings from Last 3 Encounters:  05/09/22 259 lb (117.5 kg)  03/06/22 256 lb (116.1 kg)  10/23/21 233 lb (105.7 kg)    Physical Exam Vitals and nursing note reviewed.  Constitutional:      General: He is not in acute distress.    Appearance: He is  well-developed. He is not diaphoretic.  Eyes:     General: No scleral icterus.    Conjunctiva/sclera: Conjunctivae normal.  Neck:     Thyroid: No thyromegaly.  Cardiovascular:     Rate and Rhythm: Normal rate and regular rhythm.     Heart sounds: Normal heart sounds. No murmur heard. Pulmonary:     Effort: Pulmonary effort is normal. No respiratory distress.     Breath sounds: Normal breath sounds. No wheezing.  Musculoskeletal:     Cervical back: Neck supple.  Lymphadenopathy:     Cervical: No cervical adenopathy.  Skin:    General: Skin is warm and dry.     Findings: No rash.  Neurological:     Mental Status: He is alert and oriented to person, place, and time.     Coordination: Coordination normal.  Psychiatric:        Behavior: Behavior normal.       Assessment & Plan:   Problem List Items Addressed This Visit       Other   Anxiety - Primary   Depression, recurrent ([place])     Follow up plan: Return in about 3 months (around 08/09/2022), or if symptoms worsen or fail to improve, for Anxiety recheck and physical.  Counseling provided for all of the vaccine components No orders of the defined types were placed in this encounter.   Caryl Pina, MD [facility] 05/09/2022, 10:22 AM

## 2022-08-13 ENCOUNTER — Encounter: Payer: Self-pay | Admitting: Family Medicine

## 2022-08-13 ENCOUNTER — Ambulatory Visit (INDEPENDENT_AMBULATORY_CARE_PROVIDER_SITE_OTHER): Payer: BC Managed Care – PPO | Admitting: Family Medicine

## 2022-08-13 DIAGNOSIS — F339 Major depressive disorder, recurrent, unspecified: Secondary | ICD-10-CM | POA: Diagnosis not present

## 2022-08-13 DIAGNOSIS — K219 Gastro-esophageal reflux disease without esophagitis: Secondary | ICD-10-CM

## 2022-08-13 DIAGNOSIS — J0100 Acute maxillary sinusitis, unspecified: Secondary | ICD-10-CM

## 2022-08-13 DIAGNOSIS — F419 Anxiety disorder, unspecified: Secondary | ICD-10-CM | POA: Diagnosis not present

## 2022-08-13 MED ORDER — SERTRALINE HCL 50 MG PO TABS
50.0000 mg | ORAL_TABLET | Freq: Every day | ORAL | 1 refills | Status: AC
Start: 2022-08-13 — End: ?

## 2022-08-13 MED ORDER — FLUTICASONE PROPIONATE 50 MCG/ACT NA SUSP: 2.0000 | Freq: Every day | NASAL | 11 refills | Status: AC

## 2022-08-13 MED ORDER — OMEPRAZOLE 20 MG PO CPDR: 20.0000 mg | DELAYED_RELEASE_CAPSULE | Freq: Every day | ORAL | 3 refills | Status: DC

## 2022-08-13 NOTE — Progress Notes (Signed)
BP 117/66   Pulse 71   Temp (!) 97.3 F (36.3 C)   Ht '6\' 2"'$  (1.88 m)   Wt 259 lb (117.5 kg)   SpO2 99%   BMI 33.25 kg/m    Subjective:   Patient ID: Edward Rivera, male    DOB: 07/24/1986, 36 y.o.   MRN: [id number]  HPI: [name] is a 36 y.o. male presenting on 08/13/2022 for Medical Management of Chronic Issues and Anxiety   HPI Anxiety and depression Patient is coming in today for recheck of anxiety and depression.  Currently taking Zoloft and complaints as well for him.  He denies any major side effects or issues.  He feels like he is pretty content with the medicine and how it works for him.  He denies any suicidal ideations.  The only concern that he has been possible for appetite increase but it looks like his weight has been pretty stable so we decided to keep an eye on the.  And if that becomes an issue then we can always really assess  Relevant past medical, surgical, family and social history reviewed and updated as indicated. Interim medical history since our last visit reviewed. Allergies and medications reviewed and updated.  Review of Systems  Constitutional:  Negative for chills and fever.  Eyes:  Negative for discharge.  Respiratory:  Negative for shortness of breath and wheezing.   Cardiovascular:  Negative for chest pain and leg swelling.  Musculoskeletal:  Negative for back pain and gait problem.  Skin:  Negative for rash.  Psychiatric/Behavioral:  Negative for dysphoric mood, self-injury, sleep disturbance and suicidal ideas. The patient is not nervous/anxious.   All other systems reviewed and are negative.   Per HPI unless specifically indicated above   Allergies as of 08/13/2022       Reactions   Penicillins Rash   No history of anaphylaxis        Medication List        Accurate as of August 13, 2022  8:21 AM. If you have any questions, ask your nurse or doctor.          dicyclomine 20 MG tablet Commonly known as: BENTYL TAKE 1  TABLET BY MOUTH THREE TIMES DAILY BEFORE MEAL(S)   fluticasone 50 MCG/ACT nasal spray Commonly known as: FLONASE Place 2 sprays into both nostrils daily.   loratadine 10 MG tablet Commonly known as: CLARITIN Take 1 tablet (10 mg total) by mouth daily.   multivitamin tablet Take 1 tablet by mouth daily.   omeprazole 20 MG capsule Commonly known as: PRILOSEC Take 1 capsule (20 mg total) by mouth daily.   ondansetron 4 MG disintegrating tablet Commonly known as: ZOFRAN-ODT Take 1 tablet (4 mg total) by mouth every 8 (eight) hours as needed for nausea or vomiting.   sertraline 50 MG tablet Commonly known as: Zoloft Take 1 tablet (50 mg total) by mouth daily.   traZODone 100 MG tablet Commonly known as: DESYREL Take 1 tablet (100 mg total) by mouth at bedtime.   triamcinolone 55 MCG/ACT Aero nasal inhaler Commonly known as: NASACORT Place 2 sprays into the nose daily.         Objective:   BP 117/66   Pulse 71   Temp (!) 97.3 F (36.3 C)   Ht '6\' 2"'$  (1.88 m)   Wt 259 lb (117.5 kg)   SpO2 99%   BMI 33.25 kg/m   Wt Readings from Last 3 Encounters:  08/13/22  259 lb (117.5 kg)  05/09/22 259 lb (117.5 kg)  03/06/22 256 lb (116.1 kg)    Physical Exam Vitals and nursing note reviewed.  Constitutional:      General: He is not in acute distress.    Appearance: He is well-developed. He is not diaphoretic.  Eyes:     General: No scleral icterus.    Conjunctiva/sclera: Conjunctivae normal.  Neck:     Thyroid: No thyromegaly.  Cardiovascular:     Rate and Rhythm: Normal rate and regular rhythm.     Heart sounds: Normal heart sounds. No murmur heard. Pulmonary:     Effort: Pulmonary effort is normal. No respiratory distress.     Breath sounds: Normal breath sounds. No wheezing.  Musculoskeletal:        General: No swelling. Normal range of motion.     Cervical back: Neck supple.  Lymphadenopathy:     Cervical: No cervical adenopathy.  Skin:    General: Skin is  warm and dry.     Findings: No rash.  Neurological:     Mental Status: He is alert and oriented to person, place, and time.     Coordination: Coordination normal.  Psychiatric:        Behavior: Behavior normal.       Assessment & Plan:   Problem List Items Addressed This Visit       Other   Anxiety   Relevant Medications   sertraline (ZOLOFT) 50 MG tablet   Depression, recurrent (HCC)   Relevant Medications   sertraline (ZOLOFT) 50 MG tablet   Other Visit Diagnoses     Acute maxillary sinusitis, recurrence not specified       Relevant Medications   fluticasone (FLONASE) 50 MCG/ACT nasal spray   Gastroesophageal reflux disease without esophagitis       Relevant Medications   omeprazole (PRILOSEC) 20 MG capsule       Continue current medicine, seems to be stable.  Weight is stable as well but we will keep an eye on that in the future because he says his appetite has been increased. Follow up plan: Return in about 3 months (around 11/13/2022), or if symptoms worsen or fail to improve, for Physical exam and recheck on anxiety and depression.  Counseling provided for all of the vaccine components No orders of the defined types were placed in this encounter.   Caryl Pina, MD [facility] 08/13/2022, 8:21 AM

## 2022-11-14 ENCOUNTER — Ambulatory Visit: Payer: BC Managed Care – PPO | Admitting: Family Medicine

## 2022-11-25 ENCOUNTER — Other Ambulatory Visit: Payer: Self-pay

## 2022-11-25 DIAGNOSIS — F339 Major depressive disorder, recurrent, unspecified: Secondary | ICD-10-CM

## 2022-11-25 DIAGNOSIS — F419 Anxiety disorder, unspecified: Secondary | ICD-10-CM

## 2022-11-26 ENCOUNTER — Encounter: Payer: BC Managed Care – PPO | Admitting: Family Medicine

## 2022-11-26 ENCOUNTER — Encounter: Payer: Self-pay | Admitting: Family Medicine

## 2022-12-04 ENCOUNTER — Ambulatory Visit (INDEPENDENT_AMBULATORY_CARE_PROVIDER_SITE_OTHER): Payer: BC Managed Care – PPO | Admitting: Family Medicine

## 2022-12-04 ENCOUNTER — Ambulatory Visit (HOSPITAL_COMMUNITY)
Admission: RE | Admit: 2022-12-04 | Discharge: 2022-12-04 | Disposition: A | Discharge status: Home / Self Care | Payer: BC Managed Care – PPO | Source: Ambulatory Visit | Attending: Family Medicine | Admitting: Family Medicine

## 2022-12-04 ENCOUNTER — Encounter: Payer: Self-pay | Admitting: Family Medicine

## 2022-12-04 VITALS — BP 128/78 | HR 70 | Ht 74.0 in | Wt 260.0 lb

## 2022-12-04 DIAGNOSIS — R1909 Other intra-abdominal and pelvic swelling, mass and lump: Secondary | ICD-10-CM | POA: Insufficient documentation

## 2022-12-04 NOTE — Progress Notes (Signed)
BP 128/78   Pulse 70   Ht 6' 2"$  (1.88 m)   Wt 260 lb (117.9 kg)   SpO2 97%   BMI 33.38 kg/m    Subjective:   Patient ID: Edward Rivera, male    DOB: [birth year], 37 y.o.   MRN: [id number]  HPI: Edward Rivera is a 37 y.o. male presenting on 12/04/2022 for nodule  (Right inguinal fold/thing area. Present for days, sore to touch.)   HPI Right inguinal pain Is coming in today for right inguinal pain.  He says he has been active and playing basketball and doing more things with his kids and then just a couple days ago he started feeling this sore hard lump in his right groin that is very painful to touch and also hurts to get up and move around.  He says it has been there constantly over the past 3 days and it has not improved.  He denies any pain with urination or frequency.  He denies any other abdominal pain or nausea or vomiting or constipation or blood in his stool.  He denies any trauma to that specific area that he knows of.  Relevant past medical, surgical, family and social history reviewed and updated as indicated. Interim medical history since our last visit reviewed. Allergies and medications reviewed and updated.  Review of Systems  Constitutional:  Negative for chills and fever.  Respiratory:  Negative for shortness of breath and wheezing.   Cardiovascular:  Negative for chest pain and leg swelling.  Gastrointestinal:  Negative for abdominal pain, constipation, diarrhea, nausea and vomiting.  Genitourinary:  Positive for genital sores. Negative for dysuria, frequency, penile discharge, penile pain, penile swelling, scrotal swelling, testicular pain and urgency.  Musculoskeletal:  Negative for arthralgias, back pain and gait problem.  Skin:  Negative for rash.  All other systems reviewed and are negative.   Per HPI unless specifically indicated above   Allergies as of 12/04/2022       Reactions   Penicillins Rash   No history of anaphylaxis        Medication List         Accurate as of December 04, 2022 11:58 AM. If you have any questions, ask your nurse or doctor.          dicyclomine 20 MG tablet Commonly known as: BENTYL TAKE 1 TABLET BY MOUTH THREE TIMES DAILY BEFORE MEAL(S)   fluticasone 50 MCG/ACT nasal spray Commonly known as: FLONASE Place 2 sprays into both nostrils daily.   loratadine 10 MG tablet Commonly known as: CLARITIN Take 1 tablet (10 mg total) by mouth daily.   multivitamin tablet Take 1 tablet by mouth daily.   omeprazole 20 MG capsule Commonly known as: PRILOSEC Take 1 capsule (20 mg total) by mouth daily.   ondansetron 4 MG disintegrating tablet Commonly known as: ZOFRAN-ODT Take 1 tablet (4 mg total) by mouth every 8 (eight) hours as needed for nausea or vomiting.   sertraline 50 MG tablet Commonly known as: Zoloft Take 1 tablet (50 mg total) by mouth daily.   traZODone 100 MG tablet Commonly known as: DESYREL Take 1 tablet (100 mg total) by mouth at bedtime.   triamcinolone 55 MCG/ACT Aero nasal inhaler Commonly known as: NASACORT Place 2 sprays into the nose daily.         Objective:   BP 128/78   Pulse 70   Ht 6' 2"$  (1.88 m)   Wt 260 lb (117.9 kg)  SpO2 97%   BMI 33.38 kg/m   Wt Readings from Last 3 Encounters:  12/04/22 260 lb (117.9 kg)  08/13/22 259 lb (117.5 kg)  05/09/22 259 lb (117.5 kg)    Physical Exam Vitals and nursing note reviewed.  Constitutional:      Appearance: Normal appearance.  Abdominal:     General: Abdomen is flat. Bowel sounds are normal. There is no distension.     Tenderness: There is no abdominal tenderness. There is no right CVA tenderness, left CVA tenderness, guarding or rebound.     Hernia: A hernia is present. Hernia is present in the right inguinal area (Firm 1.5 cm nodule in the right inguinal region, and nonreducible, very tender to palp patient.).  Neurological:     Mental Status: He is alert.       Assessment & Plan:   Problem List Items  Addressed This Visit   None Visit Diagnoses     Mass of right inguinal region    -  Primary   Relevant Orders   CT ABDOMEN PELVIS WO CONTRAST       Order CT abdomen stat to look at possible incarcerated inguinal hernia Follow up plan: Return if symptoms worsen or fail to improve.  Counseling provided for all of the vaccine components Orders Placed This Encounter  Procedures   [place] Jeury Mcnab, MD [facility] 12/04/2022, 11:58 AM

## 2022-12-05 ENCOUNTER — Telehealth: Payer: Self-pay | Admitting: Family Medicine

## 2022-12-05 ENCOUNTER — Emergency Department (HOSPITAL_COMMUNITY)
Admission: EM | Admit: 2022-12-05 | Discharge: 2022-12-05 | Disposition: A | Discharge status: Home / Self Care | Payer: BC Managed Care – PPO | Attending: Emergency Medicine | Admitting: Emergency Medicine

## 2022-12-05 ENCOUNTER — Other Ambulatory Visit: Payer: Self-pay

## 2022-12-05 ENCOUNTER — Encounter (HOSPITAL_COMMUNITY): Payer: Self-pay

## 2022-12-05 DIAGNOSIS — R590 Localized enlarged lymph nodes: Secondary | ICD-10-CM | POA: Insufficient documentation

## 2022-12-05 LAB — CBC
HCT: 45.9 % (ref 39.0–52.0)
Hemoglobin: 16.5 g/dL (ref 13.0–17.0)
MCH: 32.2 pg (ref 26.0–34.0)
MCHC: 35.9 g/dL (ref 30.0–36.0)
MCV: 89.6 fL (ref 80.0–100.0)
Platelets: 340 10*3/uL (ref 150–400)
RBC: 5.12 MIL/uL (ref 4.22–5.81)
RDW: 12.5 % (ref 11.5–15.5)
WBC: 10 10*3/uL (ref 4.0–10.5)
nRBC: 0 % (ref 0.0–0.2)

## 2022-12-05 LAB — BASIC METABOLIC PANEL
Anion gap: 11 (ref 5–15)
BUN: 8 mg/dL (ref 6–20)
CO2: 24 mmol/L (ref 22–32)
Calcium: 9.5 mg/dL (ref 8.9–10.3)
Chloride: 103 mmol/L (ref 98–111)
Creatinine, Ser: 1 mg/dL (ref 0.61–1.24)
GFR, Estimated: >60 mL/min (ref >60)
Glucose, Bld: 98 mg/dL (ref 70–99)
Potassium: 3.9 mmol/L (ref 3.5–5.1)
Sodium: 138 mmol/L (ref 135–145)

## 2022-12-05 MED ORDER — CEPHALEXIN 500 MG PO CAPS: 500.0000 mg | ORAL_CAPSULE | Freq: Four times a day (QID) | ORAL | 0 refills | Status: AC

## 2022-12-05 NOTE — ED Notes (Signed)
Sort RN Note: Pt observed to be waiting in no apparent distress at this time.

## 2022-12-05 NOTE — Telephone Encounter (Signed)
Patient calling to let PCP know that he is in the hospital for hernia surgery.

## 2022-12-05 NOTE — ED Provider Notes (Signed)
[facility] Provider Note   CSN: [id number] Arrival date & time: 12/05/22  1415     History  Chief Complaint  Patient presents with   Inguinal Hernia    Edward Rivera is a 37 y.o. male.  37 year old male presents with complaint of right groin mass which she noticed a few days ago, progressively worsening, painful without injury.  Patient presented to his PCP yesterday and was sent for a noncontrast CT abdomen pelvis today.  Patient states he was contacted and told to come to the emergency room for emergency surgery for a hernia that was pressing on the arteries in his leg.  Denies changes in bowel or bladder habits, nausea, vomiting, fevers.  Prior abdominal surgery includes appendectomy complicated by perforation followed with chemical washout of his abdomen.       Home Medications Prior to Admission medications   Medication Sig Start Date End Date Taking? Authorizing Provider  cephALEXin (KEFLEX) 500 MG capsule Take 1 capsule (500 mg total) by mouth 4 (four) times daily for 7 days. 12/05/22 12/12/22 Yes Tacy Learn, PA-C  dicyclomine (BENTYL) 20 MG tablet TAKE 1 TABLET BY MOUTH THREE TIMES DAILY BEFORE MEAL(S) 08/19/21   Dettinger, Fransisca Kaufmann, MD  fluticasone (FLONASE) 50 MCG/ACT nasal spray Place 2 sprays into both nostrils daily. 08/13/22   Dettinger, Fransisca Kaufmann, MD  loratadine (CLARITIN) 10 MG tablet Take 1 tablet (10 mg total) by mouth daily. 11/25/21   Janora Norlander, DO  Multiple Vitamin (MULTIVITAMIN) tablet Take 1 tablet by mouth daily.    [provider]  omeprazole (PRILOSEC) 20 MG capsule Take 1 capsule (20 mg total) by mouth daily. 08/13/22   Dettinger, Fransisca Kaufmann, MD  ondansetron (ZOFRAN-ODT) 4 MG disintegrating tablet Take 1 tablet (4 mg total) by mouth every 8 (eight) hours as needed for nausea or vomiting. 11/25/21   Janora Norlander, DO  sertraline (ZOLOFT) 50 MG tablet Take 1 tablet (50 mg total) by mouth daily.  08/13/22   Dettinger, Fransisca Kaufmann, MD  traZODone (DESYREL) 100 MG tablet Take 1 tablet (100 mg total) by mouth at bedtime. 10/23/21   Dohmeier, Asencion Partridge, MD  triamcinolone (NASACORT) 55 MCG/ACT AERO nasal inhaler Place 2 sprays into the nose daily. 10/23/21   Dohmeier, Asencion Partridge, MD      Allergies    Penicillins    Review of Systems   Review of Systems Negative except as per HPI Physical Exam Updated Vital Signs BP (!) 142/100 (BP Location: Right Arm)   Pulse 76   Temp 99.2 F (37.3 C)   Resp 18   Ht 6' 2"$  (1.88 m)   Wt 117.9 kg   SpO2 99%   BMI 33.38 kg/m  Physical Exam Vitals and nursing note reviewed.  Constitutional:      General: He is not in acute distress.    Appearance: He is well-developed. He is not diaphoretic.  HENT:     Head: Normocephalic and atraumatic.  Cardiovascular:     Pulses: Normal pulses.  Pulmonary:     Effort: Pulmonary effort is normal.  Lymphadenopathy:     Lower Body: Right inguinal adenopathy present. No left inguinal adenopathy.     Comments: Approximate 1.5 cm rubbery, mobile, tender lymph node noted to the anterior right thigh/groin area, no appreciable surrounding lymphadenopathy  Skin:    General: Skin is warm and dry.     Findings: No erythema or rash.  Neurological:  Mental Status: He is alert and oriented to person, place, and time.  Psychiatric:        Behavior: Behavior normal.     ED Results / Procedures / Treatments   Labs (all labs ordered are listed, but only abnormal results are displayed) Labs Reviewed  CBC  BASIC METABOLIC PANEL    EKG None  Radiology CT ABDOMEN PELVIS WO CONTRAST  Result Date: 12/04/2022 CLINICAL DATA:  Right lower quadrant pain for 3 days. Possible inguinal or femoral hernia. 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 EXAM: CT ABDOMEN AND PELVIS WITHOUT CONTRAST TECHNIQUE: Multidetector CT imaging of the abdomen and pelvis was performed following the standard protocol  without IV contrast. RADIATION DOSE REDUCTION: This exam was performed according to the departmental dose-optimization program which includes automated exposure control, adjustment of the mA and/or kV according to patient size and/or use of iterative reconstruction technique. COMPARISON:  07/05/2021 right upper quadrant ultrasound. CT 11/11/2016 FINDINGS: Lower chest: Clear lung bases. Normal heart size without pericardial or pleural effusion. possible right coronary artery calcification including on 04/02. This could alternatively be artifact. Hepatobiliary: Moderate hepatic steatosis and mild hepatomegaly at 18.9 cm craniocaudal. Mild caudate and lateral segment left liver lobe prominence. Mild medial left hepatic lobe atrophy. Normal gallbladder, without biliary ductal dilatation. Pancreas: Normal, without mass or ductal dilatation. Spleen: Normal in size, without focal abnormality. Adrenals/Urinary Tract: Normal adrenal glands. No renal calculi or hydronephrosis. No hydroureter or ureteric calculi. No bladder calculi. Stomach/Bowel: Normal stomach, without wall thickening. Normal colon and terminal ileum. Appendectomy. Normal small bowel. Vascular/Lymphatic: Normal caliber of the aorta and branch vessels. No abdominal adenopathy. Right external iliac node measures 9 mm on 83/2 versus 6 mm on the prior exam. Right inguinal 1.4 cm node on 91/2 is newly enlarged. Just caudal to this, a 1.4 cm node on 99/2 maintains its fatty hilum. Subtle perinodal subcutaneous edema is suspected, including on 95/2. Reproductive: Normal prostate. Other: No significant free fluid. No evidence of omental or peritoneal disease. More cephalad right paramidline subtle fat containing ventral abdominal wall hernia on 34/2. Musculoskeletal: No acute osseous abnormality. IMPRESSION: 1. No evidence of right groin hernia. 2. Mild right inguinal adenopathy with subtle perinodal subcutaneous edema suspected. Favor cellulitis and lymphadenitis.  Consider physical exam surveillance. 3. Hepatomegaly and hepatic steatosis. Hepatic morphology suspicious for mild cirrhosis. Correlate with risk factors (i.e. Ultrasound performed on 07/05/2021 for elevated liver function tests). 4. Possible markedly age advanced right coronary artery atherosclerosis versus artifact. Correlate with risk factors. Consider calcium score CT to further delineate 5. Fat containing ventral abdominal wall hernias. Electronically Signed   By: Abigail Miyamoto M.D.   On: 12/04/2022 14:55    Procedures Procedures    Medications Ordered in ED Medications - No data to display  ED Course/ Medical Decision Making/ A&P                             Medical Decision Making  37 year old male with concern for pain and mass to anterior right groin area as above. On exam, found to have a rubbery, mobile, tendern 1.5cm mass in the right anterior groin area. No overlying erythema.  Labs reassured including normal CBC.  Discussed with Dr. Festus Barren, ER attending who has seen the patient as patient was sent to the ER by their PCP with concern for need for emergent surgery.  Area does not appear to need emergency surgery,  agrees with plan for warm compresses, antibiotics and PCP recheck.  Due to patient's unusual history with prior chemo bath, recommend close follow-up with PCP, PCP informed of same.        Final Clinical Impression(s) / ED Diagnoses Final diagnoses:  Lymphadenopathy, inguinal    Rx / DC Orders ED Discharge Orders          Ordered    cephALEXin (KEFLEX) 500 MG capsule  4 times daily        12/05/22 1658              Roque Lias 12/05/22 1812    Wyvonnia Dusky, MD 12/05/22 2221

## 2022-12-05 NOTE — Telephone Encounter (Signed)
Patient called wanting to let PCP know that he is on his way to ER at [facility]. Either [facility] or [facility].

## 2022-12-05 NOTE — Discharge Instructions (Addendum)
Antibiotics as prescribed and complete the full course.  Can apply warm compresses to the area for 20 minutes at a time.  Recheck with your doctor in 1 week.  Return to ER for worsening or concerning symptoms. Recommend Motrin 610m every 8 hours with food for the next week.

## 2022-12-05 NOTE — Telephone Encounter (Signed)
Okay thanks for letting us know, hopefully goes well

## 2022-12-05 NOTE — ED Triage Notes (Signed)
Patient went to MD for lump in right groin.  Patient had CT today that reveal an inguinal hernia with edema and sent here for rule out infection or lymphadenitis.

## 2022-12-05 NOTE — ED Provider Triage Note (Signed)
Emergency Medicine Provider Triage Evaluation Note  Edward Rivera , a 37 y.o. male  was evaluated in triage.  Pt complains of right inguinal swelling x 3-4 days. Went to PCP for same yesterday, was sent for outpatient CT to rule out incarcerated hernia. Called today with results and told to come to ER.  Review of Systems  Positive: Right inguinal swelling and discomfort Negative: Redness, erythema, scrotal swelling, testicular pain, fevers, chills, abdominal pain, vomiting  Physical Exam  BP (!) 142/100 (BP Location: Right Arm)   Pulse 76   Temp 99.2 F (37.3 C)   Resp 18   Ht 6' 2"$  (1.88 m)   Wt 117.9 kg   SpO2 99%   BMI 33.38 kg/m  Gen:   Awake, no distress   Resp:  Normal effort  MSK:   Moves extremities without difficulty  Other:    Medical Decision Making  Medically screening exam initiated at 2:51 PM.  Appropriate orders placed.  Edward Rivera was informed that the remainder of the evaluation will be completed by another provider, this initial triage assessment does not replace that evaluation, and the importance of remaining in the ED until their evaluation is complete.  CT abdomen/pelvis wo contrast from yesterday shows: "IMPRESSION: 1. No evidence of right groin hernia. 2. Mild right inguinal adenopathy with subtle perinodal subcutaneous edema suspected. Favor cellulitis and lymphadenitis. Consider physical exam surveillance. 3. Hepatomegaly and hepatic steatosis. Hepatic morphology suspicious for mild cirrhosis. Correlate with risk factors (i.e. Ultrasound performed on 07/05/2021 for elevated liver function tests). 4. Possible markedly age advanced right coronary artery atherosclerosis versus artifact. Correlate with risk factors. Consider calcium score CT to further delineate 5. Fat containing ventral abdominal wall hernias."   Edward Altemose T, PA-C 12/05/22 1452

## 2022-12-08 ENCOUNTER — Telehealth: Payer: Self-pay

## 2022-12-08 NOTE — Telephone Encounter (Signed)
I saw the ER visit from [facility] and I think we do need to discuss it when he comes back.  Please make sure he has an appointment within a week or 2

## 2022-12-08 NOTE — Telephone Encounter (Signed)
ER 12/05/2022  [facility]

## 2022-12-09 NOTE — Telephone Encounter (Signed)
Appt 2/21 with dettinger to discuss

## 2022-12-10 ENCOUNTER — Encounter: Payer: Self-pay | Admitting: Family Medicine

## 2022-12-10 ENCOUNTER — Ambulatory Visit (INDEPENDENT_AMBULATORY_CARE_PROVIDER_SITE_OTHER): Payer: BC Managed Care – PPO | Admitting: Family Medicine

## 2022-12-10 VITALS — BP 138/93 | HR 83 | Temp 98.2°F | Ht 74.0 in | Wt 265.0 lb

## 2022-12-10 DIAGNOSIS — D373 Neoplasm of uncertain behavior of appendix: Secondary | ICD-10-CM | POA: Diagnosis not present

## 2022-12-10 NOTE — Progress Notes (Signed)
BP (!) 138/93   Pulse 83   Temp 98.2 F (36.8 C)   Ht 6' 2"$  (1.88 m)   Wt 265 lb (120.2 kg)   SpO2 98%   BMI 34.02 kg/m    Subjective:   Patient ID: Edward Rivera, male    DOB: [birth year], 37 y.o.   MRN: [id number]  HPI: Edward Rivera is a 37 y.o. male presenting on 12/10/2022 for Medical Management of Chronic Issues (ER follow up- right inguinal adenopathy. Still painful, swollen. Pt has been taking ATB for 5d with no improvement)   HPI Right inguinal lymphadenopathy Patient is been taking antibiotics for right inguinal adenopathy that was concerning for possible hernia.  Patient has been taking antibiotics for 5 days and still having painful groin.  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  He says he saw a Psychologist, sport and exercise at [facility] that did his treatment at that time.  Relevant past medical, surgical, family and social history reviewed and updated as indicated. Interim medical history since our last visit reviewed. Allergies and medications reviewed and updated.  Review of Systems  Constitutional:  Negative for chills and fever.  Eyes:  Negative for visual disturbance.  Respiratory:  Negative for shortness of breath and wheezing.   Cardiovascular:  Negative for chest pain and leg swelling.  Gastrointestinal:  Positive for abdominal pain.  Skin:  Negative for rash.  All other systems reviewed and are negative.   Per HPI unless specifically indicated above   Allergies as of 12/10/2022       Reactions   Penicillins Rash   No history of anaphylaxis        Medication List        Accurate as of December 10, 2022 10:04 AM. If you have any questions, ask your nurse or doctor.          cephALEXin 500 MG capsule Commonly known as: KEFLEX Take 1 capsule (500 mg total) by mouth 4 (four)  times daily for 7 days.   dicyclomine 20 MG tablet Commonly known as: BENTYL TAKE 1 TABLET BY MOUTH THREE TIMES DAILY BEFORE MEAL(S)   fluticasone 50 MCG/ACT nasal spray Commonly known as: FLONASE Place 2 sprays into both nostrils daily.   loratadine 10 MG tablet Commonly known as: CLARITIN Take 1 tablet (10 mg total) by mouth daily.   multivitamin tablet Take 1 tablet by mouth daily.   omeprazole 20 MG capsule Commonly known as: PRILOSEC Take 1 capsule (20 mg total) by mouth daily.   ondansetron 4 MG disintegrating tablet Commonly known as: ZOFRAN-ODT Take 1 tablet (4 mg total) by mouth every 8 (eight) hours as needed for nausea or vomiting.   sertraline 50 MG tablet Commonly known as: Zoloft Take 1 tablet (50 mg total) by mouth daily.   traZODone 100 MG tablet Commonly known as: DESYREL Take 1 tablet (100 mg total) by mouth at bedtime.   triamcinolone 55 MCG/ACT Aero nasal inhaler Commonly known as: NASACORT Place 2 sprays into the nose daily.         Objective:   BP (!) 138/93   Pulse 83   Temp 98.2 F (36.8 C)   Ht 6' 2"$  (1.88 m)   Wt 265 lb (120.2 kg)   SpO2 98%  BMI 34.02 kg/m   Wt Readings from Last 3 Encounters:  12/10/22 265 lb (120.2 kg)  12/05/22 260 lb (117.9 kg)  12/04/22 260 lb (117.9 kg)    Physical Exam Vitals and nursing note reviewed.  Constitutional:      General: He is not in acute distress.    Appearance: He is well-developed. He is not diaphoretic.  Eyes:     General: No scleral icterus.    Conjunctiva/sclera: Conjunctivae normal.  Neck:     Thyroid: No thyromegaly.  Cardiovascular:     Rate and Rhythm: Normal rate and regular rhythm.     Heart sounds: Normal heart sounds. No murmur heard. Pulmonary:     Effort: Pulmonary effort is normal. No respiratory distress.     Breath sounds: Normal breath sounds. No wheezing.  Musculoskeletal:        General: No swelling. Normal range of motion.     Cervical back: Neck supple.   Lymphadenopathy:     Cervical: No cervical adenopathy.  Skin:    General: Skin is warm and dry.     Findings: No rash.  Neurological:     Mental Status: He is alert and oriented to person, place, and time.     Coordination: Coordination normal.  Psychiatric:        Behavior: Behavior normal.       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  Counseling provided for all of the vaccine components Orders Placed This Encounter  Procedures   Ambulatory referral to [facility] Karmina Zufall, MD [facility] 12/10/2022, 10:04 AM

## 2022-12-25 ENCOUNTER — Telehealth: Payer: Self-pay

## 2022-12-25 NOTE — Transitions of Care (Post Inpatient/ED Visit) (Signed)
   12/25/2022  Name: Edward Rivera MRN: [id number] DOB: Jun 10, 1986  Today's TOC FU Call Status: Today's TOC FU Call Status:: Successful TOC FU Call Competed TOC FU Call Complete Date: 12/25/22  Transition Care Management Follow-up Telephone Call Date of Discharge: 12/24/22 Discharge Facility: Other ([facility]) Name of Other ([facility]) Discharge Facility: [facility] Type of Discharge: Inpatient Admission Primary Inpatient Discharge Diagnosis:: lymphadenitis How have you been since you were released from the hospital?: Same Any questions or concerns?: No  Items Reviewed: Did you receive and understand the discharge instructions provided?: Yes Medications obtained and verified?: Yes (Medications Reviewed) Any new allergies since your discharge?: No Dietary orders reviewed?: Yes Do you have support at home?: Yes People in Home: spouse  Home Care and Equipment/Supplies: [place] Ordered?: NA Any new equipment or medical supplies ordered?: NA  Functional Questionnaire: Do you need assistance with bathing/showering or dressing?: No Do you need assistance with meal preparation?: No Do you need assistance with eating?: No Do you have difficulty maintaining continence: No Do you need assistance with getting out of bed/getting out of a chair/moving?: No Do you have difficulty managing or taking your medications?: No  Folllow up appointments reviewed: [facility] Follow-up appointment confirmed?: NA Do you need transportation to your follow-up appointment?: No Do you understand care options if your condition(s) worsen?: Yes-patient verbalized understanding    [place], [place] Direct Dial [phone]

## 2023-01-05 ENCOUNTER — Telehealth: Payer: Self-pay

## 2023-01-05 NOTE — Transitions of Care (Post Inpatient/ED Visit) (Signed)
   01/05/2023  Name: Edward Rivera MRN: [id number] DOB: 1986/08/12  Today's TOC FU Call Status: Today's TOC FU Call Status:: Unsuccessul Call (1st Attempt) Unsuccessful Call (1st Attempt) Date: 01/05/23  Attempted to reach the patient regarding the most recent Inpatient/ED visit.  Follow Up Plan: Additional outreach attempts will be made to reach the patient to complete the Transitions of Care (Post Inpatient/ED visit) call.   Signature Juanda Crumble, [place] Direct Dial [phone]

## 2023-01-05 NOTE — Transitions of Care (Post Inpatient/ED Visit) (Signed)
   01/05/2023  Name: Edward Rivera MRN: [id number] DOB: 04-24-1986  Today's TOC FU Call Status: Today's TOC FU Call Status:: Successful TOC FU Call Competed Unsuccessful Call (1st Attempt) Date: 01/05/23 [facility] FU Call Complete Date: 01/05/23  Transition Care Management Follow-up Telephone Call Date of Discharge: 01/02/23 Discharge Facility: Other ([facility]) Name of Other ([facility]) Discharge Facility: [facility] Type of Discharge: Inpatient Admission Primary Inpatient Discharge Diagnosis:: enlarged lymph nodes How have you been since you were released from the hospital?: Better Any questions or concerns?: No  Items Reviewed: Did you receive and understand the discharge instructions provided?: Yes Medications obtained and verified?: Yes (Medications Reviewed) Any new allergies since your discharge?: No Dietary orders reviewed?: Yes Do you have support at home?: Yes People in Home: spouse  Home Care and Equipment/Supplies: [place] Ordered?: NA Any new equipment or medical supplies ordered?: NA  Functional Questionnaire: Do you need assistance with bathing/showering or dressing?: No Do you need assistance with meal preparation?: No Do you need assistance with eating?: No Do you have difficulty maintaining continence: No Do you need assistance with getting out of bed/getting out of a chair/moving?: No Do you have difficulty managing or taking your medications?: No  Follow up appointments reviewed: PCP Follow-up appointment confirmed?: Yes Date of PCP follow-up appointment?: 01/07/23 Follow-up Provider: [facility] Follow-up appointment confirmed?: NA Do you need transportation to your follow-up appointment?: No Do you understand care options if your condition(s) worsen?: Yes-patient verbalized understanding    [place], [place] Nurse Health Advisor Direct Dial [phone]

## 2023-01-07 ENCOUNTER — Ambulatory Visit: Payer: BC Managed Care – PPO | Admitting: Family Medicine

## 2023-01-08 ENCOUNTER — Encounter: Payer: Self-pay | Admitting: Family Medicine

## 2023-01-21 ENCOUNTER — Ambulatory Visit (INDEPENDENT_AMBULATORY_CARE_PROVIDER_SITE_OTHER): Payer: BC Managed Care – PPO | Admitting: Family Medicine

## 2023-01-21 ENCOUNTER — Encounter: Payer: Self-pay | Admitting: Family Medicine

## 2023-01-21 VITALS — BP 119/79 | HR 97 | Ht 74.0 in | Wt 258.0 lb

## 2023-01-21 DIAGNOSIS — A281 Cat-scratch disease: Secondary | ICD-10-CM

## 2023-01-21 DIAGNOSIS — I889 Nonspecific lymphadenitis, unspecified: Secondary | ICD-10-CM

## 2023-01-21 NOTE — Progress Notes (Signed)
BP 119/79   Pulse 97   Ht 6\' 2"  (1.88 m)   Wt 258 lb (117 kg)   SpO2 95%   BMI 33.13 kg/m    Subjective:   Patient ID: Edward Rivera, male    DOB: [birth year], 37 y.o.   MRN: [id number]  HPI: Edward Rivera is a 37 y.o. male presenting on 01/21/2023 for Hospitalization Follow-up (S/P inguinal lymph node removal with biopsy- site healing well per pt. Some redness. )   HPI Hospital follow-up and transition of care Patient was admitted on 12/29/2022 and discharged on 01/02/2023.  He was admitted for acute inguinal lymphadenitis and inguinal lymphadenopathy right groin pain.  He has a history of appendiceal neoplasm.  It looks like he was diagnosed with cat scratch fever from the biopsy.  He says it is healing up nicely, they removed the sutures yesterday and he had a little bit of blood when but has been looking good since then.  He says is still little sore and swollen but no redness or warmth.  He is keeping an eye on it but it is improving.  He is taking some Aleve and ibuprofen.  He does have some arthralgias occasionally in the mornings with inflammation but takes some Aleve and it helps.  Relevant past medical, surgical, family and social history reviewed and updated as indicated. Interim medical history since our last visit reviewed. Allergies and medications reviewed and updated.  Review of Systems  Constitutional:  Negative for chills and fever.  Eyes:  Negative for visual disturbance.  Respiratory:  Negative for shortness of breath and wheezing.   Cardiovascular:  Negative for chest pain and leg swelling.  Musculoskeletal:  Negative for back pain and gait problem.  Skin:  Positive for wound (Covered with a bandage today that he just placed does not want to remove at this point but surgeon saw it yesterday). Negative for color change and rash.  Neurological:  Negative for dizziness.  All other systems reviewed and are negative.   Per HPI unless specifically indicated  above   Allergies as of 01/21/2023       Reactions   Penicillins Rash   No history of anaphylaxis        Medication List        Accurate as of January 21, 2023  8:58 AM. If you have any questions, ask your nurse or doctor.          dicyclomine 20 MG tablet Commonly known as: BENTYL TAKE 1 TABLET BY MOUTH THREE TIMES DAILY BEFORE MEAL(S)   fluticasone 50 MCG/ACT nasal spray Commonly known as: FLONASE Place 2 sprays into both nostrils daily.   loratadine 10 MG tablet Commonly known as: CLARITIN Take 1 tablet (10 mg total) by mouth daily.   multivitamin tablet Take 1 tablet by mouth daily.   omeprazole 20 MG capsule Commonly known as: PRILOSEC Take 1 capsule (20 mg total) by mouth daily.   ondansetron 4 MG disintegrating tablet Commonly known as: ZOFRAN-ODT Take 1 tablet (4 mg total) by mouth every 8 (eight) hours as needed for nausea or vomiting.   sertraline 50 MG tablet Commonly known as: Zoloft Take 1 tablet (50 mg total) by mouth daily.   traZODone 100 MG tablet Commonly known as: DESYREL Take 1 tablet (100 mg total) by mouth at bedtime.   triamcinolone 55 MCG/ACT Aero nasal inhaler Commonly known as: NASACORT Place 2 sprays into the nose daily.  Objective:   BP 119/79   Pulse 97   Ht 6\' 2"  (1.88 m)   Wt 258 lb (117 kg)   SpO2 95%   BMI 33.13 kg/m   Wt Readings from Last 3 Encounters:  01/21/23 258 lb (117 kg)  12/10/22 265 lb (120.2 kg)  12/05/22 260 lb (117.9 kg)    Physical Exam Vitals and nursing note reviewed.  Abdominal:     General: Abdomen is flat. Bowel sounds are normal. There is no distension.     Tenderness: There is no abdominal tenderness. There is no guarding or rebound.  Skin:    Findings: Wound (Bandaged right groin wound) present.       Assessment & Plan:   Problem List Items Addressed This Visit   None Visit Diagnoses     Lymphadenitis    -  Primary   Cat scratch fever           Lymphadenitis  came back to be cat scratch fever and will monitor his symptoms and monitor for any signs of inflammation or infection in the wound site. Follow up plan: Return if symptoms worsen or fail to improve.  Counseling provided for all of the vaccine components No orders of the defined types were placed in this encounter.   Caryl Pina, MD [facility] 01/21/2023, 8:58 AM

## 2023-07-20 IMAGING — US US ABDOMEN LIMITED
1 series · 14 of 25 positions shown · non-contrast
Comparison: None.

CLINICAL DATA: Elevated liver function tests.

EXAM:
ULTRASOUND ABDOMEN LIMITED RIGHT UPPER QUADRANT

[Series 1: us abdomen limited · 0.22mm/px · 14 of 97 slices shown]
[im 1/97]
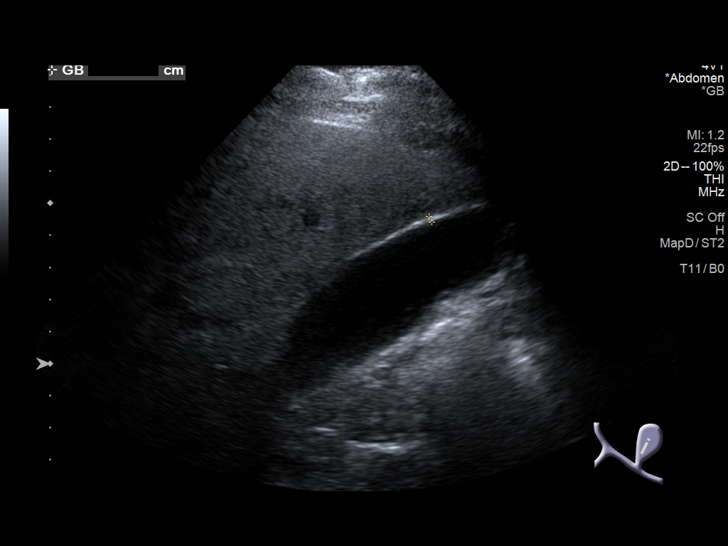
[im 9/97]
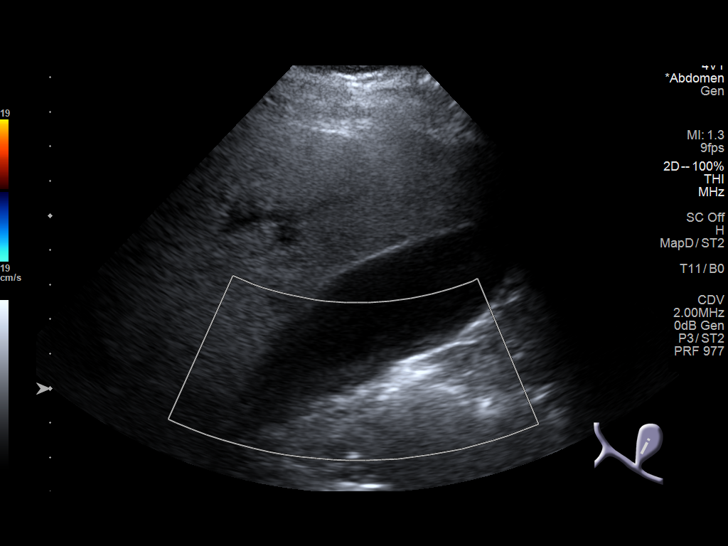
[im 17/97]
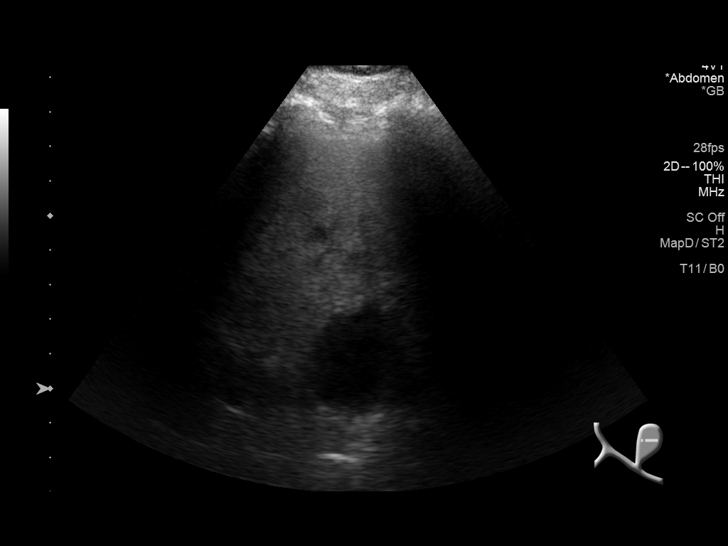
[im 25/97]
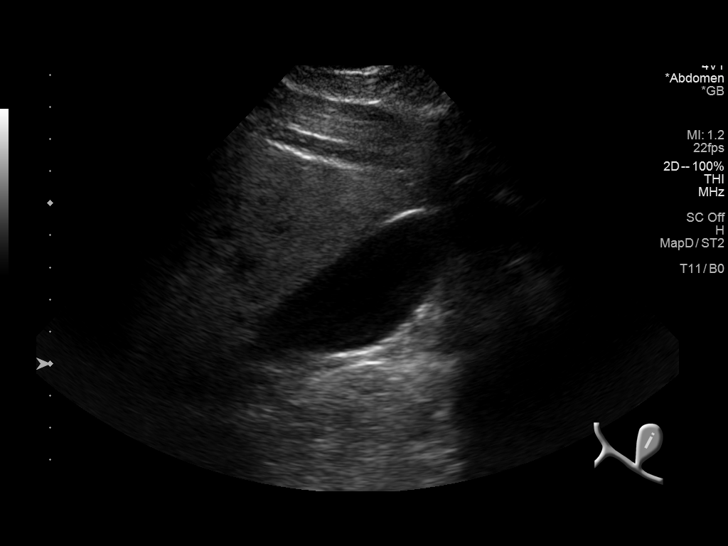
[im 33/97]
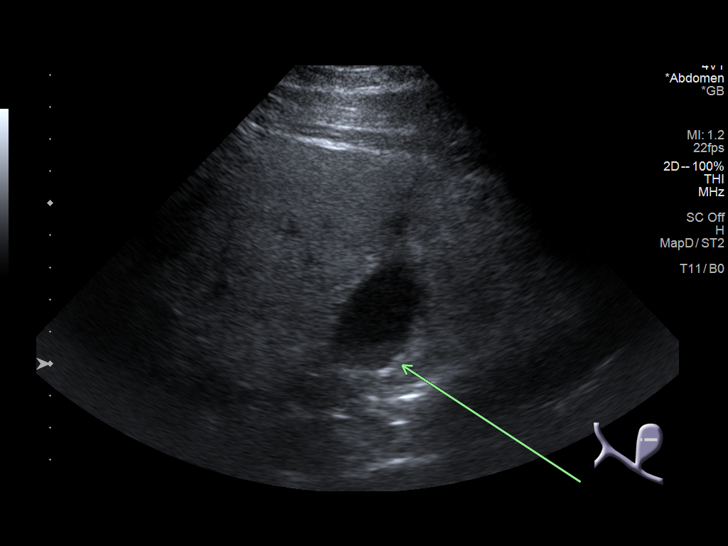
[im 37/97]
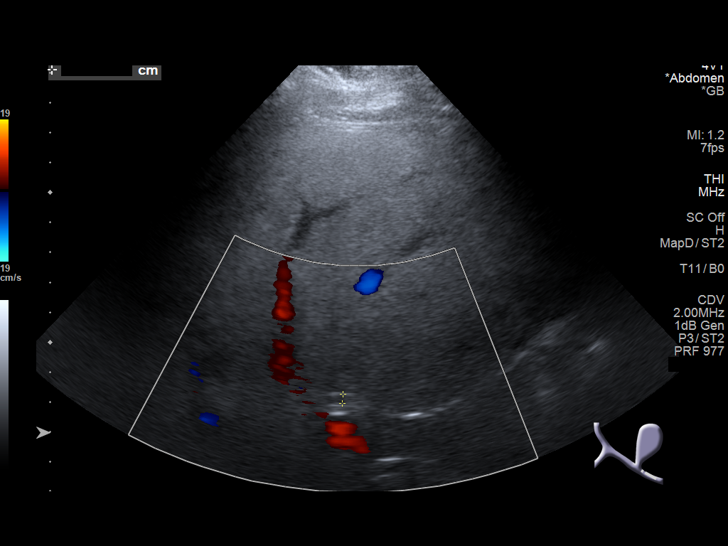
[im 45/97]
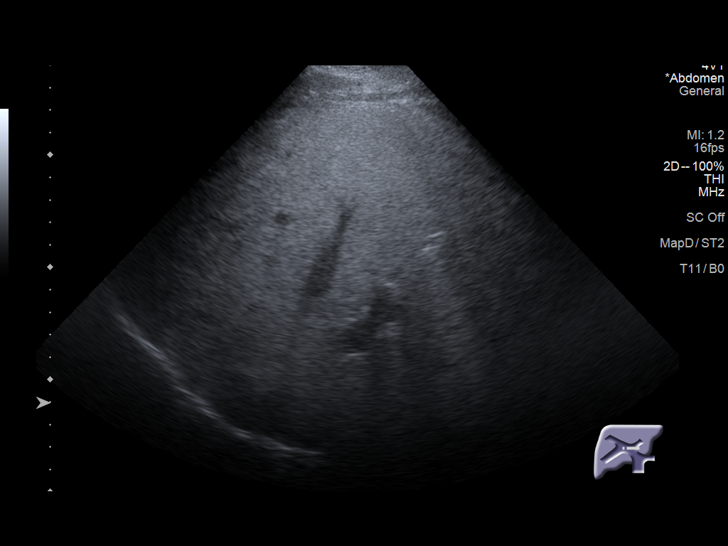
[im 53/97]
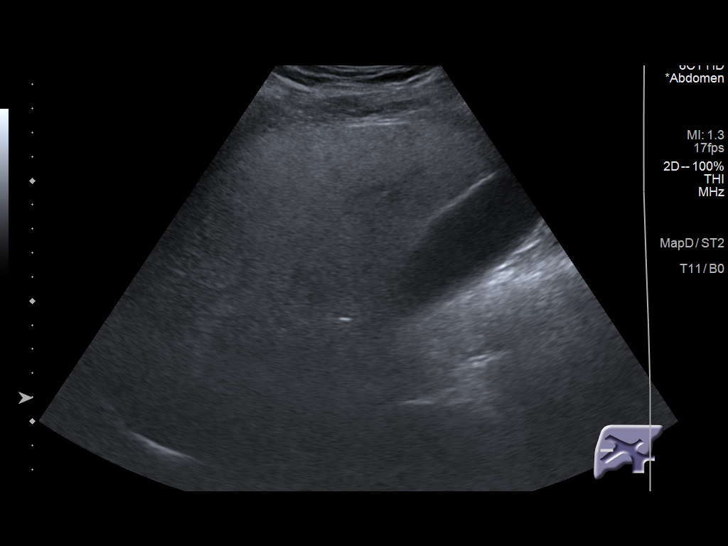
[im 61/97]
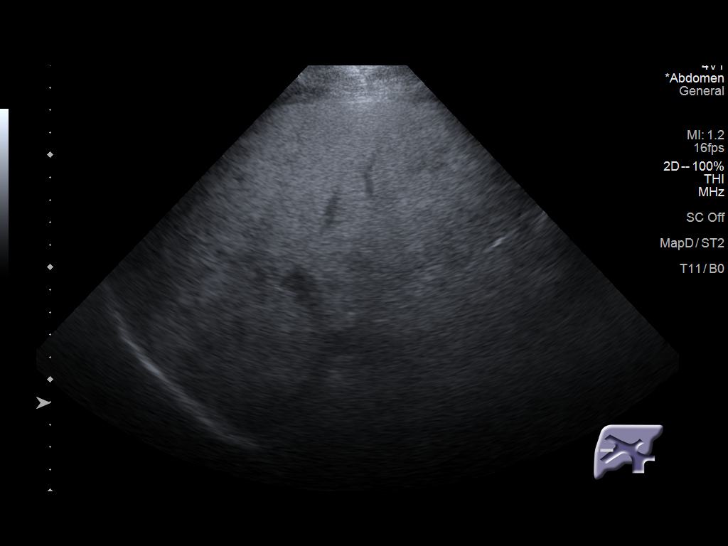
[im 65/97]
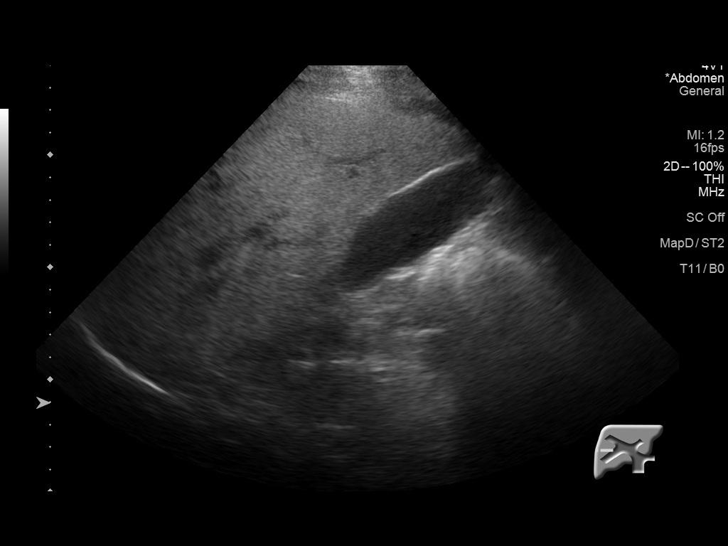
[im 73/97]
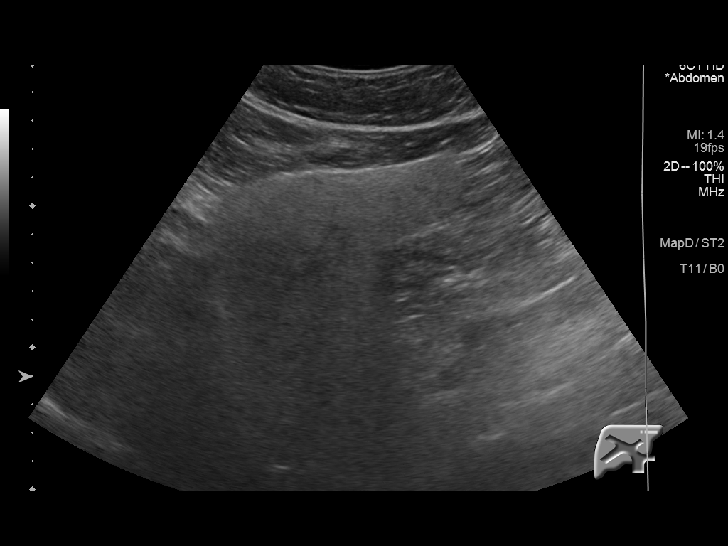
[im 81/97]
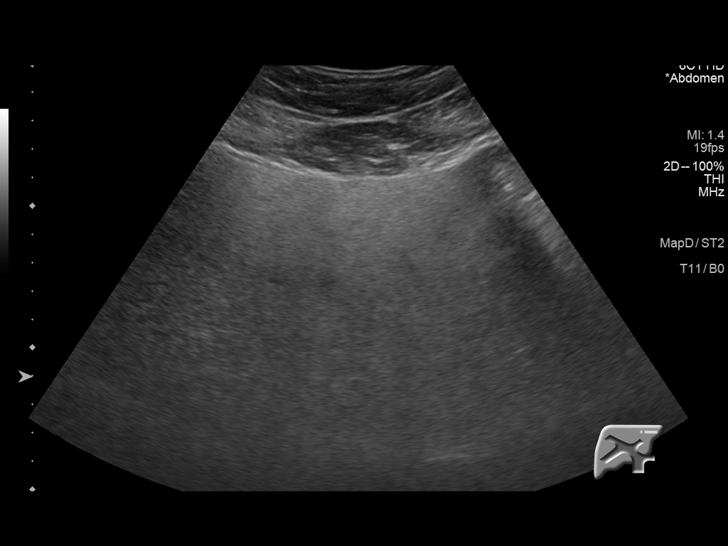
[im 89/97]
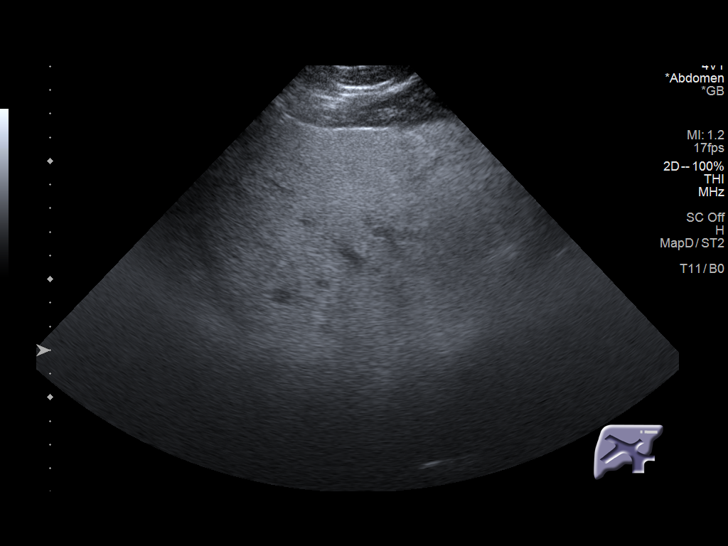
[im 97/97]
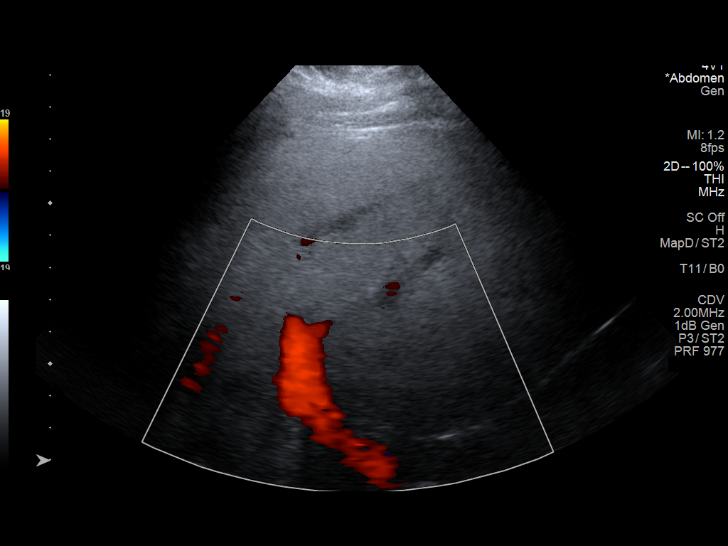

[14 of 25 positions shown; findings below may reference images not displayed]

FINDINGS: Gallbladder:

Gallbladder sludge noted within the gallbladder lumen. No gallstones
or wall thickening visualized. No pericholecystic fluid. No
sonographic Murphy sign noted by sonographer.

Common bile duct: Limited evaluation due to bowel gas.

Diameter: 3.1 mm

Liver:

No focal lesion identified. Increased parenchymal echogenicity.
Portal vein is patent on color Doppler imaging with normal direction
of blood flow towards the liver.

Other: None.
IMPRESSION: 1. Gallbladder sludge with no findings of acute cholecystitis or
choledocholithiasis.
2. Hepatic steatosis. Please note limited evaluation for focal
hepatic masses in a patient with hepatic steatosis due to decreased
penetration of the acoustic ultrasound waves.

## 2023-08-31 ENCOUNTER — Other Ambulatory Visit: Payer: Self-pay | Admitting: Family Medicine

## 2023-08-31 DIAGNOSIS — K219 Gastro-esophageal reflux disease without esophagitis: Secondary | ICD-10-CM

## 2023-10-28 ENCOUNTER — Ambulatory Visit (INDEPENDENT_AMBULATORY_CARE_PROVIDER_SITE_OTHER): Payer: Medicaid Other | Admitting: Family Medicine

## 2023-10-28 ENCOUNTER — Encounter: Payer: Self-pay | Admitting: Family Medicine

## 2023-10-28 VITALS — BP 150/93 | HR 96 | Ht 74.0 in | Wt 277.0 lb

## 2023-10-28 DIAGNOSIS — R0981 Nasal congestion: Secondary | ICD-10-CM | POA: Diagnosis not present

## 2023-10-28 DIAGNOSIS — K219 Gastro-esophageal reflux disease without esophagitis: Secondary | ICD-10-CM

## 2023-10-28 MED ORDER — OMEPRAZOLE 20 MG PO CPDR: 20.0000 mg | DELAYED_RELEASE_CAPSULE | Freq: Every day | ORAL | 3 refills | Status: AC

## 2023-10-28 MED ORDER — PREDNISONE 20 MG PO TABS: ORAL_TABLET | ORAL | 0 refills | Status: DC

## 2023-10-28 NOTE — Progress Notes (Signed)
 BP (!) 150/93   Pulse 96   Ht 6' 2 (1.88 m)   Wt 277 lb (125.6 kg)   SpO2 96%   BMI 35.56 kg/m    Subjective:   Patient ID: Edward Rivera, male    DOB: [birth year], 38 y.o.   MRN: [id number]  HPI: Edward Rivera is a 38 y.o. male presenting on 10/28/2023 for Ear Pain (bilateral) and Gastroesophageal Reflux (Refill omeprazole )   HPI Patient is coming in today with pain in both of his ears and congestion in both of his ears and sinus congestion and pressure.  He was treated for a sinus infection and bronchitis 2 to 3 weeks ago with an antibiotic and a steroid that he got over that but then 3 days ago he started with his ear pain and sinus drainage and congestion.  He has been taking Flonase  and an over-the-counter generic allergy pill and they have been helping things improved but he still is just fighting it and not getting better.  He does have some diminished hearing because of it.  He denies any fevers or chills although he did feel feverish a couple of nights ago.  He denies any shortness of breath or wheezing.  He denies any sick contacts that he knows of.  Relevant past medical, surgical, family and social history reviewed and updated as indicated. Interim medical history since our last visit reviewed. Allergies and medications reviewed and updated.  Review of Systems  Constitutional:  Positive for fever. Negative for chills.  HENT:  Positive for congestion, ear pain, hearing loss, postnasal drip, rhinorrhea and sinus pressure. Negative for ear discharge, sneezing, sore throat and voice change.   Eyes:  Negative for pain, discharge, redness and visual disturbance.  Respiratory:  Negative for cough, shortness of breath and wheezing.   Cardiovascular:  Negative for chest pain and leg swelling.  Musculoskeletal:  Negative for back pain and gait problem.  Skin:  Negative for rash.  All other systems reviewed and are negative.   Per HPI unless specifically indicated above   Allergies  as of 10/28/2023       Reactions   Penicillins Rash   No history of anaphylaxis        Medication List        Accurate as of October 28, 2023  2:49 PM. If you have any questions, ask your nurse or doctor.          dicyclomine  20 MG tablet Commonly known as: BENTYL  TAKE 1 TABLET BY MOUTH THREE TIMES DAILY BEFORE MEAL(S)   fluticasone  50 MCG/ACT nasal spray Commonly known as: FLONASE  Place 2 sprays into both nostrils daily.   loratadine  10 MG tablet Commonly known as: CLARITIN  Take 1 tablet (10 mg total) by mouth daily.   multivitamin tablet Take 1 tablet by mouth daily.   omeprazole  20 MG capsule Commonly known as: PRILOSEC Take 1 capsule (20 mg total) by mouth daily. What changed: additional instructions Changed by: Fonda LABOR Siria Calandro   ondansetron  4 MG disintegrating tablet Commonly known as: ZOFRAN -ODT Take 1 tablet (4 mg total) by mouth every 8 (eight) hours as needed for nausea or vomiting.   predniSONE  20 MG tablet Commonly known as: DELTASONE  2 po at same time daily for 5 days Started by: Fonda LABOR Maleigh Bagot   sertraline  50 MG tablet Commonly known as: Zoloft  Take 1 tablet (50 mg total) by mouth daily.   traZODone  100 MG tablet Commonly known as: DESYREL  Take 1 tablet (  100 mg total) by mouth at bedtime.   triamcinolone  55 MCG/ACT Aero nasal inhaler Commonly known as: NASACORT  Place 2 sprays into the nose daily.         Objective:   BP (!) 150/93   Pulse 96   Ht 6' 2 (1.88 m)   Wt 277 lb (125.6 kg)   SpO2 96%   BMI 35.56 kg/m   Wt Readings from Last 3 Encounters:  10/28/23 277 lb (125.6 kg)  01/21/23 258 lb (117 kg)  12/10/22 265 lb (120.2 kg)    Physical Exam Vitals and nursing note reviewed.  Constitutional:      General: He is not in acute distress.    Appearance: He is well-developed. He is not diaphoretic.  HENT:     Right Ear: Tympanic membrane is bulging. Tympanic membrane is not erythematous or retracted. Tympanic  membrane has normal mobility.     Left Ear: Tympanic membrane is bulging. Tympanic membrane is not erythematous or retracted. Tympanic membrane has normal mobility.  Eyes:     General: No scleral icterus.    Conjunctiva/sclera: Conjunctivae normal.  Neck:     Thyroid: No thyromegaly.  Cardiovascular:     Rate and Rhythm: Normal rate and regular rhythm.     Heart sounds: Normal heart sounds. No murmur heard. Pulmonary:     Effort: Pulmonary effort is normal. No respiratory distress.     Breath sounds: Normal breath sounds. No wheezing.  Musculoskeletal:        General: Normal range of motion.     Cervical back: Neck supple.  Lymphadenopathy:     Cervical: No cervical adenopathy.  Skin:    General: Skin is warm and dry.     Findings: No rash.  Neurological:     Mental Status: He is alert and oriented to person, place, and time.     Coordination: Coordination normal.  Psychiatric:        Behavior: Behavior normal.       Assessment & Plan:   Problem List Items Addressed This Visit   None Visit Diagnoses       Sinus congestion    -  Primary   Relevant Medications   predniSONE  (DELTASONE ) 20 MG tablet     Gastroesophageal reflux disease without esophagitis       Relevant Medications   omeprazole  (PRILOSEC) 20 MG capsule       Patient does need a refill of his GERD medicine we will do that, he still needs to come back for physical. Follow up plan: Return if symptoms worsen or fail to improve, for Physical exam sometime this year and blood work.  Counseling provided for all of the vaccine components No orders of the defined types were placed in this encounter.   Fonda Levins, MD [facility] 10/28/2023, 2:49 PM

## 2023-10-29 ENCOUNTER — Other Ambulatory Visit: Payer: Self-pay | Admitting: Family Medicine

## 2023-10-29 DIAGNOSIS — K219 Gastro-esophageal reflux disease without esophagitis: Secondary | ICD-10-CM

## 2024-11-16 ENCOUNTER — Ambulatory Visit: Payer: Self-pay

## 2024-11-16 ENCOUNTER — Ambulatory Visit: Admitting: Family Medicine

## 2024-11-16 ENCOUNTER — Encounter: Payer: Self-pay | Admitting: Family Medicine

## 2024-11-16 VITALS — BP 112/72 | HR 91 | Temp 98.2°F | Ht 74.0 in | Wt 243.0 lb

## 2024-11-16 DIAGNOSIS — J329 Chronic sinusitis, unspecified: Secondary | ICD-10-CM | POA: Diagnosis not present

## 2024-11-16 DIAGNOSIS — R6889 Other general symptoms and signs: Secondary | ICD-10-CM

## 2024-11-16 DIAGNOSIS — J40 Bronchitis, not specified as acute or chronic: Secondary | ICD-10-CM | POA: Diagnosis not present

## 2024-11-16 LAB — VERITOR SARS-COV-2 AND FLU A+B
BD Veritor SARS-CoV-2 Ag: NEGATIVE
Influenza A: NEGATIVE
Influenza B: NEGATIVE

## 2024-11-16 MED ORDER — BETAMETHASONE SOD PHOS & ACET 6 (3-3) MG/ML IJ SUSP
6.0000 mg | Freq: Once | INTRAMUSCULAR | Status: AC
  Administered 2024-11-16: 6 mg via INTRAMUSCULAR

## 2024-11-16 MED ORDER — LEVOFLOXACIN 500 MG PO TABS: 500.0000 mg | ORAL_TABLET | Freq: Every day | ORAL | 0 refills | Status: DC

## 2024-11-16 MED ORDER — PSEUDOEPHEDRINE-GUAIFENESIN ER 120-1200 MG PO TB12: 1.0000 | ORAL_TABLET | Freq: Two times a day (BID) | ORAL | 0 refills | Status: AC

## 2024-11-16 NOTE — Telephone Encounter (Signed)
 Apt scheduled.

## 2024-11-16 NOTE — Patient Instructions (Signed)
 Contains text generated by [employer].

## 2024-11-16 NOTE — Telephone Encounter (Signed)
 FYI Only or Action Required?: FYI only for provider: appointment scheduled on 1/29.  Patient was last seen in primary care on 10/28/2023 by Dettinger, Fonda LABOR, MD.  Called Nurse Triage reporting Cough.  Symptoms began several days ago.  Interventions attempted: Rest, hydration, or home remedies.  Symptoms are: gradually worsening.  Triage Disposition: See HCP Within 4 Hours (Or PCP Triage)  Patient/caregiver understands and will follow disposition?: Yes  Reason for Triage: Patient states that he's coughing up dark green mucus that started last night. He stated he went to the urgent care about 4 days ago and they told him that he could have the flu and to treat the symptoms as if it is the flu. Patient states he has dizziness, light headed, stomach pain, fever this morning of 100.1, diarrhea, and he's nauseous but no vomiting.   Reason for Disposition  [1] MILD difficulty breathing (e.g., minimal/no SOB at rest, SOB with walking, pulse < 100) AND [2] still present when not coughing  Answer Assessment - Initial Assessment Questions 1. ONSET: When did the cough begin?      4 days 2. SEVERITY: How bad is the cough today?      persistent 3. SPUTUM: Describe the color of your sputum (e.g., none, dry cough; clear, white, yellow, green)     green  5. DIFFICULTY BREATHING: Are you having difficulty breathing? If Yes, ask: How bad is it? (e.g., mild, moderate, severe)      Sob with activity 6. FEVER: Do you have a fever? If Yes, ask: What is your temperature, how was it measured, and when did it start?     100.1 7. CARDIAC HISTORY: Do you have any history of heart disease? (e.g., heart attack, congestive heart failure)      no 8. LUNG HISTORY: Do you have any history of lung disease?  (e.g., pulmonary embolus, asthma, emphysema)     no  10. OTHER SYMPTOMS: Do you have any other symptoms? (e.g., runny nose, wheezing, chest pain)       Nasal congestion, mild cp with  cough only  Protocols used: Cough - Acute Productive-A-AH

## 2024-11-16 NOTE — Progress Notes (Signed)
 "  Subjective:  Patient ID: Edward Rivera, male    DOB: 04/28/1986  Age: 39 y.o. MRN: [id number]  CC: sick (Started yesterday. Coughing up thick green mucous, nasal congestion, chest pain from coughing, stomach pain, and diarrhea. Fever this morning. No known exposure. )   HPI  Discussed the use of AI scribe software for clinical note transcription with the patient, who gave verbal consent to proceed.  History of Present Illness Edward Rivera is a 39 year old male who presents with persistent cough, runny nose, and green nasal discharge. He is accompanied by his children.  He has been experiencing a persistent cough, runny nose, and green nasal discharge for several days. Four days ago, he visited [facility] where he was informed of having flu-like symptoms, but a flu test was negative.  This morning, he had a fever of 101F. He also experiences a sore throat and pain in the sinuses, particularly when blowing his nose. The nasal discharge is described as yellow and green, and he coughs up similar colored mucus.  He is currently taking amoxicillin . He recalls taking prednisone  about six weeks ago.  No facial pressure except when blowing his nose. He confirms having a sore throat and fever.          10/28/2023    2:39 PM 01/21/2023    8:42 AM 12/10/2022    9:18 AM  Depression screen PHQ 2/9  Decreased Interest 0 0 0  Down, Depressed, Hopeless 0 0 0  PHQ - 2 Score 0 0 0  Altered sleeping  0 2  Tired, decreased energy  0 0  Change in appetite  2 1  Feeling bad or failure about yourself   0 0  Trouble concentrating  0 0  Moving slowly or fidgety/restless  0 0  Suicidal thoughts  0 0  PHQ-9 Score  2  3   Difficult doing work/chores  Not difficult at all Not difficult at all     Data saved with a previous flowsheet row definition    History Edward Rivera has a past medical history of Allergy, Celiac disease, Fatty liver disease, nonalcoholic, GERD (gastroesophageal reflux disease), and  Seizures (HCC).   He has a past surgical history that includes Hand surgery (Right); Upper gastrointestinal endoscopy; Colonoscopy (06/2016); and laparoscopic appendectomy (N/A, 11/11/2016).   His family history includes Breast cancer in his paternal grandmother; Cancer in his mother; Colon cancer in his maternal grandmother; Diabetes in his maternal grandfather, maternal grandmother, paternal grandfather, and paternal grandmother; Early death in his father and maternal uncle; Heart disease in his paternal uncle; Hypertension in his father and sister.He reports that he has never smoked. He has never used smokeless tobacco. He reports that he does not currently use alcohol after a past usage of about 7.0 standard drinks of alcohol per week. He reports that he does not use drugs.    ROS Review of Systems  Constitutional:  Negative for activity change, appetite change, chills and fever.  HENT:  Positive for congestion, postnasal drip, rhinorrhea and sinus pressure. Negative for ear discharge, ear pain, hearing loss, nosebleeds, sneezing and trouble swallowing.   Respiratory:  Positive for cough. Negative for chest tightness and shortness of breath.   Cardiovascular:  Negative for chest pain and palpitations.  Skin:  Negative for rash.    Objective:  BP 112/72   Pulse 91   Temp 98.2 F (36.8 C)   Ht 6' 2 (1.88 m)   Wt 243 lb (  110.2 kg)   SpO2 96%   BMI 31.20 kg/m   BP Readings from Last 3 Encounters:  11/16/24 112/72  10/28/23 (!) 150/93  01/21/23 119/79    Wt Readings from Last 3 Encounters:  11/16/24 243 lb (110.2 kg)  10/28/23 277 lb (125.6 kg)  01/21/23 258 lb (117 kg)     Physical Exam Physical Exam GENERAL: Alert, cooperative, well developed, no acute distress. HEENT: Normocephalic, normal oropharynx, moist mucous membranes. Sinuses tender to palpation. CHEST: Clear to auscultation bilaterally. No wheezes, rhonchi, or crackles. CARDIOVASCULAR: Normal heart rate and  rhythm, S1 and S2 normal without murmurs. ABDOMEN: Soft, non-tender, non-distended, without organomegaly. Normal bowel sounds. EXTREMITIES: No cyanosis or edema. NEUROLOGICAL: Cranial nerves grossly intact. Moves all extremities without gross motor or sensory deficit.   Assessment & Plan:  Flu-like symptoms -     Veritor SARS-CoV-2 and Flu A+B  Sinobronchitis -     Betamethasone  Sod Phos & Acet  Other orders -     levoFLOXacin ; Take 1 tablet (500 mg total) by mouth daily. For 10 days  Dispense: 10 tablet; Refill: 0 -     Pseudoephedrine -guaiFENesin  ER; Take 1 tablet by mouth 2 (two) times daily. For congestion  Dispense: 14 tablet; Refill: 0    Assessment and Plan Assessment & Plan Acute upper respiratory infection   He presents with cough, rhinorrhea, green-yellow nasal discharge, sore throat, and a fever of 101F. Flu test is negative. Sinus palpation reveals tenderness. Currently on amoxicillin  and cough medicine. Prednisone  use six months ago permits cortisone injection. Administered cortisone injection. Prescribed Levaquin  for 10 days. Recommended Mucinex  D as a decongestant.       Follow-up: Return if symptoms worsen or fail to improve.  Butler Der, M.D. "

## 2024-11-17 ENCOUNTER — Ambulatory Visit: Payer: Self-pay

## 2024-11-17 ENCOUNTER — Other Ambulatory Visit: Payer: Self-pay | Admitting: Family Medicine

## 2024-11-17 MED ORDER — SULFAMETHOXAZOLE-TRIMETHOPRIM 800-160 MG PO TABS: 1.0000 | ORAL_TABLET | Freq: Two times a day (BID) | ORAL | 0 refills | Status: AC

## 2024-11-17 MED ORDER — DIPHENOXYLATE-ATROPINE 2.5-0.025 MG PO TABS: 2.0000 | ORAL_TABLET | Freq: Four times a day (QID) | ORAL | 0 refills | Status: AC | PRN

## 2024-11-17 NOTE — Telephone Encounter (Signed)
 I sent in an antibiotic and a diarrhea medicine.

## 2024-11-17 NOTE — Telephone Encounter (Signed)
 FYI Only or Action Required?: Action required by provider: clinical question for provider.  Patient was last seen in primary care on 11/16/2024 by Edward Lowers, MD.  Called Nurse Triage reporting Diarrhea.  Symptoms began yesterday.  Interventions attempted: Rest, hydration, or home remedies.  Symptoms are: gradually worsening.  Triage Disposition: Call PCP Now (overriding See HCP Within 4 Hours (Or PCP Triage))  Patient/caregiver understands and will follow disposition?: Yes  Reason for Disposition  SEVERE diarrhea (e.g., 7 or more times / day more than normal)  Answer Assessment - Initial Assessment Questions Patient was seen in office yesterday, 1/28, and prescribed Levofloxacin . He started taking medication yesterday and began experiencing diarrhea as well. Today he has started to experience dizziness and has had constant abdominal pain. Office visit advised per triage, but patient is calling in to request medication change.   1. ANTIBIOTIC: What antibiotic are you taking? How many times per day?     Levofloxacin  500 mg tablet  2. ANTIBIOTIC ONSET: When was the antibiotic started?     Yesterday 1/28  3. DIARRHEA SEVERITY: How bad is the diarrhea? How many more stools have you had in the past 24 hours than normal?      Severe, states that he has gone multiple times today  4. ONSET: When did the diarrhea begin?      Yesterday  5. BM CONSISTENCY: How loose or watery is the diarrhea?      Watery   6. VOMITING: Are you also vomiting? If Yes, ask: How many times in the past 24 hours?      No  7. ABDOMEN PAIN: Are you having any abdomen pain? If Yes, ask: What does it feel like? (e.g., crampy, dull, intermittent, constant)      Yes constant  8. ABDOMEN PAIN SEVERITY: If present, ask: How bad is the pain?  (e.g., Scale 1-10; mild, moderate, or severe)     5/10  9. ORAL INTAKE: If vomiting, Have you been able to drink liquids? How much liquids have  you had in the past 24 hours?     Yes able to drink fluids  10. HYDRATION: Any signs of dehydration? (e.g., dry mouth [not just dry lips], too weak to stand, dizziness, new weight loss) When did you last urinate?       Reports dizziness and headaches starting today  11. EXPOSURE: Have you traveled to a foreign country recently? Have you been exposed to anyone with diarrhea? Could you have eaten any food that was spoiled?       No  12. OTHER SYMPTOMS: Do you have any other symptoms? (e.g., fever, blood in stool)       Denies any other symptoms  13. PREGNANCY: Is there any chance you are pregnant? When was your last menstrual period?       NA  Protocols used: Diarrhea on Antibiotics-A-AH  Copied from CRM #[id number]. Topic: Clinical - Medication Question >> Nov 17, 2024 10:22 AM Edward Rivera wrote: Reason for CRM: Patient is calling because the medicine he was giving levofloxacin  (LEVAQUIN ) 500 MG tablet has caused him to use the restroom more than usual, he literally can't get off the toilet because he will have to have to use the restroom. Could you call him because he is wanting another antibiotic sent in due to the side effects of this one. He feels dizzy and he has a headache as well. Callback number is [phone]. >> Nov 17, 2024 11:22 AM Edward Rivera wrote: The  patient called back because he states he had a missed call. I don't see where a nurse has tried to contact him yet and I told him and he said it is possible that it could have been anybody else he was just wondering. I told him he will be hearing from a nurse today and that I hope he feels better. He was very adult nurse.

## 2024-11-17 NOTE — Telephone Encounter (Signed)
Pt notified.    LS
